# Patient Record
Sex: Female | Born: 1980 | Race: Black or African American | Hispanic: No | Marital: Single | Smoking: Never smoker
Health system: Southern US, Community
[De-identification: ages and names within clinical notes are randomized; demographics above are authoritative.]

## PROBLEM LIST (undated history)

## (undated) DIAGNOSIS — I10 Essential (primary) hypertension: Secondary | ICD-10-CM

## (undated) DIAGNOSIS — J45909 Unspecified asthma, uncomplicated: Secondary | ICD-10-CM

## (undated) HISTORY — PX: ABDOMINAL HYSTERECTOMY: SHX81

---

## 2013-09-06 ENCOUNTER — Ambulatory Visit (HOSPITAL_COMMUNITY)
Admission: RE | Admit: 2013-09-06 | Discharge: 2013-09-06 | Disposition: A | Discharge status: Home / Self Care | Payer: BC Managed Care – PPO | Source: Ambulatory Visit | Attending: Allergy and Immunology | Admitting: Allergy and Immunology

## 2013-09-06 ENCOUNTER — Other Ambulatory Visit: Payer: Self-pay | Admitting: Allergy and Immunology

## 2013-09-06 DIAGNOSIS — R05 Cough: Secondary | ICD-10-CM

## 2013-09-06 DIAGNOSIS — R059 Cough, unspecified: Secondary | ICD-10-CM

## 2014-01-14 ENCOUNTER — Emergency Department (HOSPITAL_COMMUNITY)
Admission: EM | Admit: 2014-01-14 | Discharge: 2014-01-14 | Disposition: A | Discharge status: Home / Self Care | Payer: BC Managed Care – PPO | Attending: Emergency Medicine | Admitting: Emergency Medicine

## 2014-01-14 ENCOUNTER — Encounter (HOSPITAL_COMMUNITY): Payer: Self-pay | Admitting: Emergency Medicine

## 2014-01-14 DIAGNOSIS — S161XXA Strain of muscle, fascia and tendon at neck level, initial encounter: Secondary | ICD-10-CM | POA: Diagnosis not present

## 2014-01-14 DIAGNOSIS — S3992XA Unspecified injury of lower back, initial encounter: Secondary | ICD-10-CM | POA: Diagnosis not present

## 2014-01-14 DIAGNOSIS — Y9389 Activity, other specified: Secondary | ICD-10-CM | POA: Insufficient documentation

## 2014-01-14 DIAGNOSIS — Y92410 Unspecified street and highway as the place of occurrence of the external cause: Secondary | ICD-10-CM | POA: Insufficient documentation

## 2014-01-14 DIAGNOSIS — J45909 Unspecified asthma, uncomplicated: Secondary | ICD-10-CM | POA: Diagnosis not present

## 2014-01-14 DIAGNOSIS — S199XXA Unspecified injury of neck, initial encounter: Secondary | ICD-10-CM | POA: Diagnosis present

## 2014-01-14 DIAGNOSIS — Y998 Other external cause status: Secondary | ICD-10-CM | POA: Diagnosis not present

## 2014-01-14 HISTORY — DX: Unspecified asthma, uncomplicated: J45.909

## 2014-01-14 MED ORDER — NAPROXEN 500 MG PO TABS: 500.0000 mg | ORAL_TABLET | Freq: Two times a day (BID) | ORAL | Status: DC

## 2014-01-14 NOTE — Discharge Instructions (Signed)

## 2014-01-14 NOTE — ED Notes (Signed)
Pt alert & oriented x4, stable gait. Patient given discharge instructions, paperwork & prescription(s). Patient  instructed to stop at the registration desk to finish any additional paperwork. Patient verbalized understanding. Pt left department w/ no further questions. 
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
## 2014-09-01 ENCOUNTER — Encounter (HOSPITAL_COMMUNITY): Payer: Self-pay | Admitting: Emergency Medicine

## 2014-09-01 ENCOUNTER — Emergency Department (HOSPITAL_COMMUNITY)
Admission: EM | Admit: 2014-09-01 | Discharge: 2014-09-01 | Disposition: A | Discharge status: Home / Self Care | Payer: BLUE CROSS/BLUE SHIELD | Attending: Emergency Medicine | Admitting: Emergency Medicine

## 2014-09-01 DIAGNOSIS — Z7951 Long term (current) use of inhaled steroids: Secondary | ICD-10-CM | POA: Insufficient documentation

## 2014-09-01 DIAGNOSIS — Z7901 Long term (current) use of anticoagulants: Secondary | ICD-10-CM | POA: Insufficient documentation

## 2014-09-01 DIAGNOSIS — M791 Myalgia, unspecified site: Secondary | ICD-10-CM

## 2014-09-01 DIAGNOSIS — J45909 Unspecified asthma, uncomplicated: Secondary | ICD-10-CM | POA: Insufficient documentation

## 2014-09-01 DIAGNOSIS — G8918 Other acute postprocedural pain: Secondary | ICD-10-CM | POA: Diagnosis not present

## 2014-09-01 DIAGNOSIS — M7981 Nontraumatic hematoma of soft tissue: Secondary | ICD-10-CM | POA: Diagnosis not present

## 2014-09-01 DIAGNOSIS — M79602 Pain in left arm: Secondary | ICD-10-CM | POA: Diagnosis present

## 2014-09-01 MED ORDER — IBUPROFEN 800 MG PO TABS: 800.0000 mg | ORAL_TABLET | Freq: Three times a day (TID) | ORAL | Status: DC | PRN

## 2014-09-01 MED ORDER — IBUPROFEN 800 MG PO TABS
800.0000 mg | ORAL_TABLET | Freq: Once | ORAL | Status: AC
  Administered 2014-09-01: 800 mg via ORAL
  Filled 2014-09-01: qty 1

## 2014-09-01 NOTE — ED Notes (Signed)
   09/01/14 [id number]  Musculoskeletal  Musculoskeletal (WDL) X  LUE Limited movement;Other (Comment)  Pt c/o left upper arm pain since getting vaccine last Tuesday. There is a small bruise the the left upper arm.

## 2014-09-01 NOTE — Discharge Instructions (Signed)
You were seen today for arm pain. It is at the site of your tetanus shot injection. It is not uncommon to have soreness at the site of injection especially after a tetanus vaccine. Below his general information regarding tetanus vaccines. You should use anti-inflammatory medication like ibuprofen for pain management. Use ice to the site. If you develops redness, increasing pain, or tingling or numbness of the arm you should be reevaluated.  Diphtheria/Tetanus Toxoids; Pertussis Vaccine, DTP injection What is this medicine? DIPHTHERIA and TETANUS TOXOIDS; PERTUSSIS VACCINE (dif THEER ee uh and TET n [place] TOK soids; per TUS iss VAK seen) is used to prevent diphtheria, tetanus, and pertussis infections. This medicine may be used for other purposes; ask your health care provider or pharmacist if you have questions. COMMON BRAND NAME(S): Adacel, Boostrix, Certiva, Daptacel, Infanrix, Tripedia What should I tell my health care provider before I take this medicine? They need to know if you have any of these conditions: -blood disorders like hemophilia -fever or infection -immune system problems -neurologic disease -seizures -an unusual or allergic reaction to vaccines, thimerosal, latex, other medicines, foods, dyes, or preservatives -pregnant or trying to get pregnant -breast-feeding How should I use this medicine? This vaccine is for injection into a muscle. It is given by a health care professional. A copy of Vaccine Information Statements will be given before each vaccination. Read this sheet carefully each time. The sheet may change frequently. Talk to your pediatrician regarding the use of this vaccine in children. While the DTP vaccine may be given to children ages 6 weeks to 7 years and the Tdap vaccine may be given to children at least 35 years old, precautions do apply. Overdosage: If you think you have taken too much of this medicine contact a poison control center or emergency room at  once. NOTE: This medicine is only for you. Do not share this medicine with others. What if I miss a dose? It is important not to miss your dose. Call your doctor or health care professional if you are unable to keep an appointment. What may interact with this medicine? -immune globulin -medicines that suppress your immune function like adalimumab, anakinra, infliximab -medicines to treat cancer -medicines that treat or prevent blood clots like warfarin, enoxaparin, and dalteparin -steroid medicines like prednisone or cortisone This list may not describe all possible interactions. Give your health care provider a list of all the medicines, herbs, non-prescription drugs, or dietary supplements you use. Also tell them if you smoke, drink alcohol, or use illegal drugs. Some items may interact with your medicine. What should I watch for while using this medicine? See your health care provider for all shots of this vaccine as directed. To have protection from infection, you must have 3 shots of this vaccine plus boosters as needed. Tell your doctor right away if you have any serious or unusual side effects after getting this vaccine. What side effects may I notice from receiving this medicine? Side effects that you should report to your doctor or health care professional as soon as possible: -allergic reactions like skin rash, itching or hives, swelling of the face, lips, or tongue -breathing problems -fever of 103 degrees F or more -flu-like symptoms -inconsolable crying -infection -pain, tingling, numbness in the hands or feet -seizures -swelling of arm or leg that was injected -unusually weak or tired Side effects that usually do not require immediate medical attention (report these side effects to your doctor or health care professional if they continue  or are bothersome): -fussy, irritable -loss of appetite -fever of 102 degrees F or less -pain, tenderness, redness, swelling, or a 'knot'  at site where injected -vomiting This list may not describe all possible side effects. Call your doctor for medical advice about side effects. You may report side effects to FDA at 1-800-FDA-1088. Where should I keep my medicine? This drug is given in a hospital or clinic and will not be stored at home. NOTE: This sheet is a summary. It may not cover all possible information. If you have questions about this medicine, talk to your doctor, pharmacist, or health care provider.  2015, Elsevier/Gold Standard. (2013-06-21 21:24:02)

## 2014-09-01 NOTE — ED Provider Notes (Signed)
CSN: [id number]     Arrival date & time 09/01/14  0620 History   First MD Initiated Contact with Patient 09/01/14 0631     Chief Complaint  Patient presents with  . Arm Pain     (Consider location/radiation/quality/duration/timing/severity/associated sxs/prior Treatment) HPI  This is a 34 year old female who presents with pain in the left arm. Patient reports that she has had increasing pain in the left upper extremity following a tetanus injection last Tuesday. She states that she was initially sore but had improved but developed repeat pain this week.  She used a hot pack last night which did not help.  Currently rates her pain a 9 out of 10. She had some Norco at home for dental procedure and states that that did not help. She has noted some bruising right at the injection site.  Past Medical History  Diagnosis Date  . Asthma    Past Surgical History  Procedure Laterality Date  . Abdominal hysterectomy      partial 2014   History reviewed. No pertinent family history. History  Substance Use Topics  . Smoking status: Never Smoker   . Smokeless tobacco: Not on file  . Alcohol Use: No   OB History    No data available     Review of Systems  Musculoskeletal:       Arm pain  Skin:       bruising      Allergies  Review of patient's allergies indicates no known allergies.  Home Medications   Prior to Admission medications   Medication Sig Start Date End Date Taking? Authorizing Provider  ibuprofen (ADVIL,MOTRIN) 800 MG tablet Take 1 tablet (800 mg total) by mouth every 8 (eight) hours as needed. 09/01/14   Shon Batonourtney F Horton, MD  mometasone (ASMANEX) 220 MCG/INH inhaler Inhale 1-2 puffs into the lungs daily.    Historical Provider, MD  naproxen (NAPROSYN) 500 MG tablet Take 1 tablet (500 mg total) by mouth 2 (two) times daily. 01/14/14   Azalia BilisKevin Campos, MD   BP 144/87 mmHg  Pulse 79  Temp(Src) 98.4 F (36.9 C)  Resp 18  Ht 5\' 6"  (1.676 m)  Wt 177 lb (80.287 kg)   BMI 28.58 kg/m2  SpO2 99%  LMP 09/01/2012 Physical Exam  Constitutional: She is oriented to person, place, and time. She appears well-developed and well-nourished.  HENT:  Head: Normocephalic and atraumatic.  Cardiovascular: Normal rate and regular rhythm.   Pulmonary/Chest: Effort normal. No respiratory distress.  Musculoskeletal:  Tenderness palpation over the left deltoid, mild bruising noted, no significant swelling, neurovascularly intact distally  Neurological: She is alert and oriented to person, place, and time.  Skin: Skin is warm and dry.  Psychiatric: She has a normal mood and affect.  Nursing note and vitals reviewed.   ED Course  Procedures (including critical care time) Labs Review Labs Reviewed - No data to display  Imaging Review No results found.   EKG Interpretation None      MDM   Final diagnoses:  Muscle pain    Patient presents with musculoskeletal pain at tdap Injection site. Mild bruising noted at the site.  Patient has recently had worsening of pain. She has not been taking anti-inflammatory. She's been applying heat. This is likely worsening the pain. Pain is right over the injection site. No obvious injuries, inflammation. Discussed with patient high dose anti-inflammatory and ice to the site. Avoid heavy lifting with the left arm until pain has improved.  After history,  exam, and medical workup I feel the patient has been appropriately medically screened and is safe for discharge home. Pertinent diagnoses were discussed with the patient. Patient was given return precautions.     Shon Baton, MD 09/04/14 [phone]

## 2014-09-01 NOTE — ED Notes (Signed)
Pt c/o left arm pain since getting hepatitis vaccine over a week ago.

## 2017-05-02 DIAGNOSIS — R109 Unspecified abdominal pain: Secondary | ICD-10-CM | POA: Insufficient documentation

## 2017-05-02 DIAGNOSIS — Z8742 Personal history of other diseases of the female genital tract: Secondary | ICD-10-CM | POA: Insufficient documentation

## 2017-05-02 DIAGNOSIS — R300 Dysuria: Secondary | ICD-10-CM | POA: Insufficient documentation

## 2017-05-02 DIAGNOSIS — A499 Bacterial infection, unspecified: Secondary | ICD-10-CM | POA: Insufficient documentation

## 2017-05-02 DIAGNOSIS — E669 Obesity, unspecified: Secondary | ICD-10-CM | POA: Insufficient documentation

## 2017-05-14 DIAGNOSIS — I10 Essential (primary) hypertension: Secondary | ICD-10-CM | POA: Insufficient documentation

## 2017-06-02 ENCOUNTER — Other Ambulatory Visit (HOSPITAL_COMMUNITY): Payer: Self-pay | Admitting: Neurology

## 2017-06-02 DIAGNOSIS — R569 Unspecified convulsions: Secondary | ICD-10-CM

## 2017-06-05 ENCOUNTER — Encounter (HOSPITAL_COMMUNITY): Payer: Self-pay

## 2017-06-05 ENCOUNTER — Ambulatory Visit (HOSPITAL_COMMUNITY)
Admission: RE | Admit: 2017-06-05 | Discharge: 2017-06-05 | Disposition: A | Discharge status: Home / Self Care | Payer: BLUE CROSS/BLUE SHIELD | Source: Ambulatory Visit | Attending: Neurology | Admitting: Neurology

## 2017-06-10 ENCOUNTER — Ambulatory Visit (HOSPITAL_COMMUNITY): Payer: BLUE CROSS/BLUE SHIELD

## 2017-06-10 ENCOUNTER — Ambulatory Visit (HOSPITAL_COMMUNITY)
Admission: RE | Admit: 2017-06-10 | Discharge: 2017-06-10 | Disposition: A | Discharge status: Home / Self Care | Payer: BLUE CROSS/BLUE SHIELD | Source: Ambulatory Visit | Attending: Neurology | Admitting: Neurology

## 2017-06-10 DIAGNOSIS — J349 Unspecified disorder of nose and nasal sinuses: Secondary | ICD-10-CM | POA: Insufficient documentation

## 2017-06-10 DIAGNOSIS — R569 Unspecified convulsions: Secondary | ICD-10-CM | POA: Diagnosis present

## 2017-06-10 LAB — POCT I-STAT CREATININE: CREATININE: 1.1 mg/dL — AB (ref 0.44–1.00)

## 2017-06-10 MED ORDER — GADOBENATE DIMEGLUMINE 529 MG/ML IV SOLN
15.0000 mL | Freq: Once | INTRAVENOUS | Status: AC | PRN
  Administered 2017-06-10: 15 mL via INTRAVENOUS

## 2018-05-08 ENCOUNTER — Other Ambulatory Visit (HOSPITAL_COMMUNITY)
Admission: RE | Admit: 2018-05-08 | Discharge: 2018-05-08 | Disposition: A | Discharge status: Home / Self Care | Payer: BLUE CROSS/BLUE SHIELD | Source: Ambulatory Visit | Attending: Internal Medicine | Admitting: Internal Medicine

## 2018-05-08 DIAGNOSIS — R319 Hematuria, unspecified: Secondary | ICD-10-CM | POA: Diagnosis not present

## 2018-05-08 LAB — URINALYSIS, ROUTINE W REFLEX MICROSCOPIC
BILIRUBIN URINE: NEGATIVE
GLUCOSE, UA: NEGATIVE mg/dL
Hgb urine dipstick: NEGATIVE
Ketones, ur: NEGATIVE mg/dL
Leukocytes,Ua: NEGATIVE
Nitrite: NEGATIVE
PH: 6 (ref 5.0–8.0)
PROTEIN: NEGATIVE mg/dL
Specific Gravity, Urine: 1.025 (ref 1.005–1.030)

## 2018-06-03 ENCOUNTER — Emergency Department (HOSPITAL_COMMUNITY)
Admission: EM | Admit: 2018-06-03 | Discharge: 2018-06-03 | Disposition: A | Discharge status: Home / Self Care | Payer: BLUE CROSS/BLUE SHIELD | Attending: Emergency Medicine | Admitting: Emergency Medicine

## 2018-06-03 ENCOUNTER — Encounter (HOSPITAL_COMMUNITY): Payer: Self-pay | Admitting: Emergency Medicine

## 2018-06-03 ENCOUNTER — Emergency Department (HOSPITAL_COMMUNITY): Payer: BLUE CROSS/BLUE SHIELD

## 2018-06-03 ENCOUNTER — Other Ambulatory Visit: Payer: Self-pay

## 2018-06-03 DIAGNOSIS — R0602 Shortness of breath: Secondary | ICD-10-CM | POA: Diagnosis not present

## 2018-06-03 DIAGNOSIS — I10 Essential (primary) hypertension: Secondary | ICD-10-CM | POA: Insufficient documentation

## 2018-06-03 DIAGNOSIS — R0789 Other chest pain: Secondary | ICD-10-CM | POA: Insufficient documentation

## 2018-06-03 DIAGNOSIS — R1013 Epigastric pain: Secondary | ICD-10-CM | POA: Diagnosis present

## 2018-06-03 DIAGNOSIS — J45909 Unspecified asthma, uncomplicated: Secondary | ICD-10-CM | POA: Diagnosis not present

## 2018-06-03 DIAGNOSIS — R52 Pain, unspecified: Secondary | ICD-10-CM

## 2018-06-03 DIAGNOSIS — R079 Chest pain, unspecified: Secondary | ICD-10-CM | POA: Insufficient documentation

## 2018-06-03 DIAGNOSIS — Z79899 Other long term (current) drug therapy: Secondary | ICD-10-CM | POA: Diagnosis not present

## 2018-06-03 HISTORY — DX: Essential (primary) hypertension: I10

## 2018-06-03 LAB — BASIC METABOLIC PANEL
Anion gap: 9 (ref 5–15)
BUN: 12 mg/dL (ref 6–20)
CO2: 25 mmol/L (ref 22–32)
Calcium: 9.4 mg/dL (ref 8.9–10.3)
Chloride: 104 mmol/L (ref 98–111)
Creatinine, Ser: 1.04 mg/dL — ABNORMAL HIGH (ref 0.44–1.00)
GFR calc Af Amer: >60 mL/min (ref >60)
GFR calc non Af Amer: >60 mL/min (ref >60)
Glucose, Bld: 113 mg/dL — ABNORMAL HIGH (ref 70–99)
Potassium: 4.2 mmol/L (ref 3.5–5.1)
Sodium: 138 mmol/L (ref 135–145)

## 2018-06-03 LAB — CBC
HCT: 40.7 % (ref 36.0–46.0)
Hemoglobin: 13.1 g/dL (ref 12.0–15.0)
MCH: 28.5 pg (ref 26.0–34.0)
MCHC: 32.2 g/dL (ref 30.0–36.0)
MCV: 88.5 fL (ref 80.0–100.0)
Platelets: 271 10*3/uL (ref 150–400)
RBC: 4.6 MIL/uL (ref 3.87–5.11)
RDW: 13.1 % (ref 11.5–15.5)
WBC: 3 10*3/uL — ABNORMAL LOW (ref 4.0–10.5)
nRBC: 0 % (ref 0.0–0.2)

## 2018-06-03 LAB — TROPONIN I: Troponin I: <0.03 ng/mL (ref <0.03)

## 2018-06-03 MED ORDER — SODIUM CHLORIDE 0.9% FLUSH: 3.0000 mL | Freq: Once | INTRAVENOUS | Status: DC

## 2018-06-03 MED ORDER — PANTOPRAZOLE SODIUM 20 MG PO TBEC: 20.0000 mg | DELAYED_RELEASE_TABLET | Freq: Every day | ORAL | 0 refills | Status: DC

## 2018-06-03 MED ORDER — ALUM & MAG HYDROXIDE-SIMETH 200-200-20 MG/5ML PO SUSP
30.0000 mL | Freq: Once | ORAL | Status: AC
  Administered 2018-06-03: 30 mL via ORAL
  Filled 2018-06-03: qty 30

## 2018-06-03 MED ORDER — ALBUTEROL SULFATE HFA 108 (90 BASE) MCG/ACT IN AERS
2.0000 | INHALATION_SPRAY | RESPIRATORY_TRACT | Status: DC | PRN
  Administered 2018-06-03: 14:00:00 2 via RESPIRATORY_TRACT
  Filled 2018-06-03: qty 6.7

## 2018-06-03 MED ORDER — LIDOCAINE VISCOUS HCL 2 % MT SOLN
15.0000 mL | Freq: Once | OROMUCOSAL | Status: AC
  Administered 2018-06-03: 12:00:00 15 mL via ORAL
  Filled 2018-06-03: qty 15

## 2018-06-03 NOTE — ED Provider Notes (Signed)
[facility] EMERGENCY DEPARTMENT Provider Note   CSN: [id number] Arrival date & time: 06/03/18  1039    History   Chief Complaint Chief Complaint  Patient presents with  . Chest Pain    HPI Nancy Powell is a 38 y.o. female.     Patient complains of epigastric and substernal pain.  With mild shortness of breath.  Patient states that she did work with someone who with covid-19 positive.  Patient has no fever  The history is provided by the patient. No language interpreter was used.  Chest Pain  Pain location:  Substernal area Pain quality: aching   Pain radiates to:  Does not radiate Pain severity:  Mild Onset quality:  Sudden Timing:  Intermittent Progression:  Waxing and waning Chronicity:  New Context: not breathing   Associated symptoms: abdominal pain   Associated symptoms: no back pain, no cough, no fatigue and no headache     Past Medical History:  Diagnosis Date  . Asthma   . Hypertension     There are no active problems to display for this patient.   Past Surgical History:  Procedure Laterality Date  . ABDOMINAL HYSTERECTOMY     partial 2014     OB History   No obstetric history on file.      Home Medications    Prior to Admission medications   Medication Sig Start Date End Date Taking? Authorizing Provider  hydrochlorothiazide (HYDRODIURIL) 12.5 MG tablet Take 1 tablet by mouth daily. 02/17/18  Yes [provider]  losartan (COZAAR) 50 MG tablet Take 1 tablet by mouth daily. 05/20/18  Yes [provider]  mometasone (ASMANEX) 220 MCG/INH inhaler Inhale 1-2 puffs into the lungs daily.   Yes [provider]  rizatriptan (MAXALT) 10 MG tablet Take 10 mg by mouth as needed for migraine. May repeat in 2 hours if needed   Yes [provider]  topiramate (TOPAMAX) 25 MG tablet Take 1 tablet by mouth 2 (two) times daily. 02/23/18  Yes [provider]  ibuprofen (ADVIL,MOTRIN) 800 MG tablet Take 1 tablet  (800 mg total) by mouth every 8 (eight) hours as needed. Patient not taking: Reported on 06/03/2018 09/01/14   Horton, Mayer Masker, MD  naproxen (NAPROSYN) 500 MG tablet Take 1 tablet (500 mg total) by mouth 2 (two) times daily. Patient not taking: Reported on 06/03/2018 01/14/14   Azalia Bilis, MD  pantoprazole (PROTONIX) 20 MG tablet Take 1 tablet (20 mg total) by mouth daily. 06/03/18   Bethann Berkshire, MD    Family History No family history on file.  Social History Social History   Tobacco Use  . Smoking status: Never Smoker  . Smokeless tobacco: Never Used  Substance Use Topics  . Alcohol use: No  . Drug use: No     Allergies   Patient has no known allergies.   Review of Systems Review of Systems  Constitutional: Negative for appetite change and fatigue.  HENT: Negative for congestion, ear discharge and sinus pressure.   Eyes: Negative for discharge.  Respiratory: Negative for cough.   Cardiovascular: Positive for chest pain.  Gastrointestinal: Positive for abdominal pain. Negative for diarrhea.  Genitourinary: Negative for frequency and hematuria.  Musculoskeletal: Negative for back pain.  Skin: Negative for rash.  Neurological: Negative for seizures and headaches.  Psychiatric/Behavioral: Negative for hallucinations.     Physical Exam Updated Vital Signs BP 113/84   Pulse 60   Temp 98.3 F (36.8 C)   Resp 13  Ht  (1.651 m)   Wt 82.1 kg   LMP 09/01/2012 Comment: Partial Hysterectomy  SpO2 100%   BMI 30.12 kg/m   Physical Exam Constitutional:      Appearance: She is well-developed.  HENT:     Head: Normocephalic.  Eyes:     General: No scleral icterus.    Conjunctiva/sclera: Conjunctivae normal.  Neck:     Musculoskeletal: Neck supple.     Thyroid: No thyromegaly.  Cardiovascular:     Rate and Rhythm: Normal rate and regular rhythm.     Heart sounds: No murmur. No friction rub. No gallop.   Pulmonary:     Breath sounds: No stridor. Wheezing  present. No rales.  Chest:     Chest wall: No tenderness.  Abdominal:     General: There is no distension.     Tenderness: There is no abdominal tenderness. There is no rebound.  Musculoskeletal: Normal range of motion.  Lymphadenopathy:     Cervical: No cervical adenopathy.  Skin:    Findings: No erythema or rash.  Neurological:     Mental Status: She is oriented to person, place, and time.     Motor: No abnormal muscle tone.     Coordination: Coordination normal.  Psychiatric:        Behavior: Behavior normal.      ED Treatments / Results  Labs (all labs ordered are listed, but only abnormal results are displayed) Labs Reviewed  BASIC METABOLIC PANEL - Abnormal; Notable for the following components:      Result Value   Glucose, Bld 113 (*)    Creatinine, Ser 1.04 (*)    All other components within normal limits  CBC - Abnormal; Notable for the following components:   WBC 3.0 (*)    All other components within normal limits  TROPONIN I    EKG EKG Interpretation  Date/Time:  Wednesday June 03 2018 11:04:42 EDT Ventricular Rate:  71 PR Interval:    QRS Duration: 90 QT Interval:  377 QTC Calculation: 410 R Axis:   82 Text Interpretation:  Sinus rhythm Confirmed by Bethann Berkshire [phone]) on 06/03/2018 1:18:17 PM   Radiology Dg Chest Port 1 View  Result Date: 06/03/2018 CLINICAL DATA:  38 year old female with a history of chest pain EXAM: PORTABLE CHEST 1 VIEW COMPARISON:  09/06/2013 FINDINGS: The heart size and mediastinal contours are within normal limits. Both lungs are clear. The visualized skeletal structures are unremarkable. IMPRESSION: Negative for acute cardiopulmonary disease Electronically Signed   By: Gilmer Mor D.O.   On: 06/03/2018 12:09    Procedures Procedures (including critical care time)  Medications Ordered in ED Medications  sodium chloride flush (NS) 0.9 % injection 3 mL (has no administration in time range)  albuterol (PROVENTIL  HFA;VENTOLIN HFA) 108 (90 Base) MCG/ACT inhaler 2 puff (has no administration in time range)  alum & mag hydroxide-simeth (MAALOX/MYLANTA) 200-200-20 MG/5ML suspension 30 mL (30 mLs Oral Given 06/03/18 1206)    And  lidocaine (XYLOCAINE) 2 % viscous mouth solution 15 mL (15 mLs Oral Given 06/03/18 1206)     Initial Impression / Assessment and Plan / ED Course  I have reviewed the triage vital signs and the nursing notes.  Pertinent labs & imaging results that were available during my care of the patient were reviewed by me and considered in my medical decision making (see chart for details).        Patient with chest discomfort.  Suspect it is related  to GERD she will be placed on Protonix.  Also she is given albuterol inhaler for her asthma and will follow-up with her PCP  Final Clinical Impressions(s) / ED Diagnoses   Final diagnoses:  Atypical chest pain    ED Discharge Orders         Ordered    pantoprazole (PROTONIX) 20 MG tablet  Daily     06/03/18 1334           Bethann Berkshire, MD 06/03/18 1339

## 2018-06-03 NOTE — Discharge Instructions (Addendum)
Use the albuterol inhaler every 6 hours if needed for some shortness of breath.  Follow-up with your family doctor if any problems.  Self quarantine if you develop any fever.      Person Under Monitoring Name: Nancy Powell  Location: [address] [place] [address]   Infection Prevention Recommendations for Individuals Confirmed to have, or Being Evaluated for, 2019 Novel Coronavirus (COVID-19) Infection Who Receive Care at Home  Individuals who are confirmed to have, or are being evaluated for, COVID-19 should follow the prevention steps below until a healthcare provider or local or state health department says they can return to normal activities.  Stay home except to get medical care You should restrict activities outside your home, except for getting medical care. Do not go to work, school, or public areas, and do not use public transportation or taxis.  Call ahead before visiting your doctor Before your medical appointment, call the healthcare provider and tell them that you have, or are being evaluated for, COVID-19 infection. This will help the healthcare providers office take steps to keep other people from getting infected. Ask your healthcare provider to call the local or state health department.  Monitor your symptoms Seek prompt medical attention if your illness is worsening (e.g., difficulty breathing). Before going to your medical appointment, call the healthcare provider and tell them that you have, or are being evaluated for, COVID-19 infection. Ask your healthcare provider to call the local or state health department.  Wear a facemask You should wear a facemask that covers your nose and mouth when you are in the same room with other people and when you visit a healthcare provider. People who live with or visit you should also wear a facemask while they are in the same room with you.  Separate yourself from other people in your home As much as possible, you  should stay in a different room from other people in your home. Also, you should use a separate bathroom, if available.  Avoid sharing household items You should not share dishes, drinking glasses, cups, eating utensils, towels, bedding, or other items with other people in your home. After using these items, you should wash them thoroughly with soap and water.  Cover your coughs and sneezes Cover your mouth and nose with a tissue when you cough or sneeze, or you can cough or sneeze into your sleeve. Throw used tissues in a lined trash can, and immediately wash your hands with soap and water for at least 20 seconds or use an alcohol-based hand rub.  Wash your [employer] your hands often and thoroughly with soap and water for at least 20 seconds. You can use an alcohol-based hand sanitizer if soap and water are not available and if your hands are not visibly dirty. Avoid touching your eyes, nose, and mouth with unwashed hands.   Prevention Steps for Caregivers and Household Members of Individuals Confirmed to have, or Being Evaluated for, COVID-19 Infection Being Cared for in the Home  If you live with, or provide care at home for, a person confirmed to have, or being evaluated for, COVID-19 infection please follow these guidelines to prevent infection:  Follow healthcare providers instructions Make sure that you understand and can help the patient follow any healthcare provider instructions for all care.  Provide for the patients basic needs You should help the patient with basic needs in the home and provide support for getting groceries, prescriptions, and other personal needs.  Monitor the patients  symptoms If they are getting sicker, call his or her medical provider and tell them that the patient has, or is being evaluated for, COVID-19 infection. This will help the healthcare providers office take steps to keep other people from getting infected. Ask the healthcare provider  to call the local or state health department.  Limit the number of people who have contact with the patient If possible, have only one caregiver for the patient. Other household members should stay in another home or place of residence. If this is not possible, they should stay in another room, or be separated from the patient as much as possible. Use a separate bathroom, if available. Restrict visitors who do not have an essential need to be in the home.  Keep older adults, very young children, and other sick people away from the patient Keep older adults, very young children, and those who have compromised immune systems or chronic health conditions away from the patient. This includes people with chronic heart, lung, or kidney conditions, diabetes, and cancer.  Ensure good ventilation Make sure that shared spaces in the home have good air flow, such as from an air conditioner or an opened window, weather permitting.  Wash your hands often Wash your hands often and thoroughly with soap and water for at least 20 seconds. You can use an alcohol based hand sanitizer if soap and water are not available and if your hands are not visibly dirty. Avoid touching your eyes, nose, and mouth with unwashed hands. Use disposable paper towels to dry your hands. If not available, use dedicated cloth towels and replace them when they become wet.  Wear a facemask and gloves Wear a disposable facemask at all times in the room and gloves when you touch or have contact with the patients blood, body fluids, and/or secretions or excretions, such as sweat, saliva, sputum, nasal mucus, vomit, urine, or feces.  Ensure the mask fits over your nose and mouth tightly, and do not touch it during use. Throw out disposable facemasks and gloves after using them. Do not reuse. Wash your hands immediately after removing your facemask and gloves. If your personal clothing becomes contaminated, carefully remove clothing and  launder. Wash your hands after handling contaminated clothing. Place all used disposable facemasks, gloves, and other waste in a lined container before disposing them with other household waste. Remove gloves and wash your hands immediately after handling these items.  Do not share dishes, glasses, or other household items with the patient Avoid sharing household items. You should not share dishes, drinking glasses, cups, eating utensils, towels, bedding, or other items with a patient who is confirmed to have, or being evaluated for, COVID-19 infection. After the person uses these items, you should wash them thoroughly with soap and water.  Wash laundry thoroughly Immediately remove and wash clothes or bedding that have blood, body fluids, and/or secretions or excretions, such as sweat, saliva, sputum, nasal mucus, vomit, urine, or feces, on them. Wear gloves when handling laundry from the patient. Read and follow directions on labels of laundry or clothing items and detergent. In general, wash and dry with the warmest temperatures recommended on the label.  Clean all areas the individual has used often Clean all touchable surfaces, such as counters, tabletops, doorknobs, bathroom fixtures, toilets, phones, keyboards, tablets, and bedside tables, every day. Also, clean any surfaces that may have blood, body fluids, and/or secretions or excretions on them. Wear gloves when cleaning surfaces the patient has come in contact  with. Use a diluted bleach solution (e.g., dilute bleach with 1 part bleach and 10 parts water) or a household disinfectant with a label that says EPA-registered for coronaviruses. To make a bleach solution at home, add 1 tablespoon of bleach to 1 quart (4 cups) of water. For a larger supply, add  cup of bleach to 1 gallon (16 cups) of water. Read labels of cleaning products and follow recommendations provided on product labels. Labels contain instructions for safe and effective  use of the cleaning product including precautions you should take when applying the product, such as wearing gloves or eye protection and making sure you have good ventilation during use of the product. Remove gloves and wash hands immediately after cleaning.  Monitor yourself for signs and symptoms of illness Caregivers and household members are considered close contacts, should monitor their health, and will be asked to limit movement outside of the home to the extent possible. Follow the monitoring steps for close contacts listed on the symptom monitoring form.   ? If you have additional questions, contact your local health department or call the epidemiologist on call at [phone] (available 24/7). ? This guidance is subject to change. For the most up-to-date guidance from [facility], please refer to their website: [email or web address]

## 2018-06-03 NOTE — ED Triage Notes (Signed)
Pt states she has central chest pain since last week.    Coworker tested positive for COVID.  Was told about the coworker on Monday

## 2019-05-10 IMAGING — MR MR HEAD WO/W CM
8 of 13 series · 27 of 48 positions shown · IV contrast (multihance)
Comparison: None.

CLINICAL DATA: Headache over the last 2 months. History seizures 2
years ago.

EXAM:
MRI HEAD WITHOUT AND WITH CONTRAST
TECHNIQUE: Multiplanar, multiecho pulse sequences of the brain and surrounding
structures were obtained without and with intravenous contrast.
CONTRAST:  15mL MULTIHANCE GADOBENATE DIMEGLUMINE 529 MG/ML IV SOLN

[Series 4: DWI · axial · 3.0mm · 0.65mm/px · z∈[-30,+129]mm · 5 of 54 slices shown (1 of 4)]
[im 1/54]
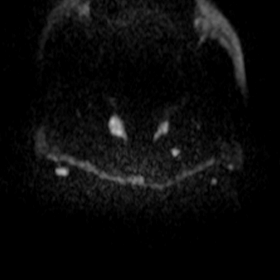
[im 14/54]
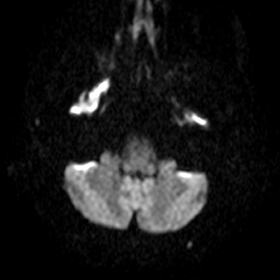
[im 27/54]
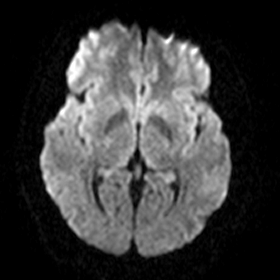
[im 40/54]
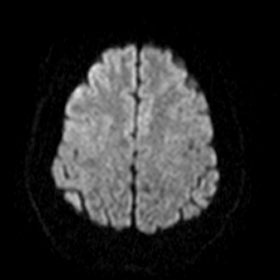
[im 54/54]
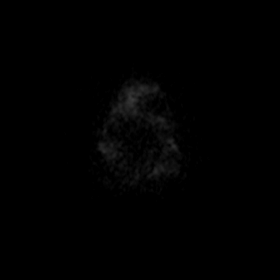

[Series 5: DWI · axial · 3.0mm · 0.71mm/px · z∈[-33,+129]mm · 5 of 55 slices shown (2 of 4)]
[im 1/55]
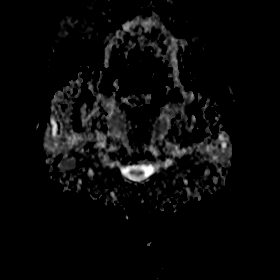
[im 14/55]
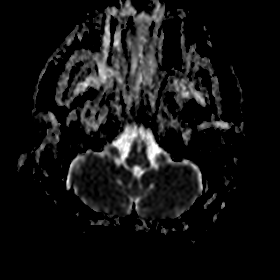
[im 28/55]
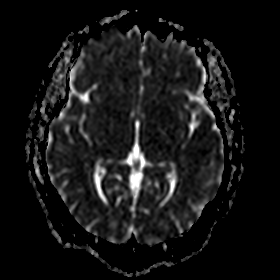
[im 41/55]
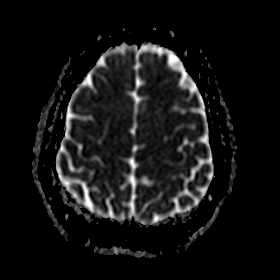
[im 55/55]
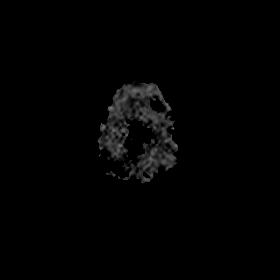

[Series 6: DWI · coronal · 5.0mm · 0.47mm/px · 3 of 34 slices shown (3 of 4)]
[im 1/34]
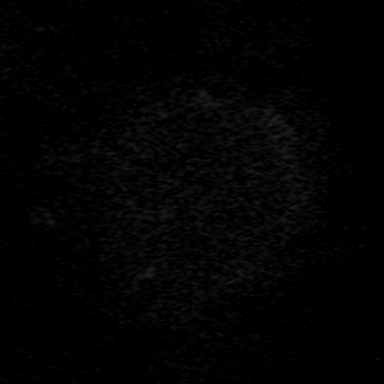
[im 17/34]
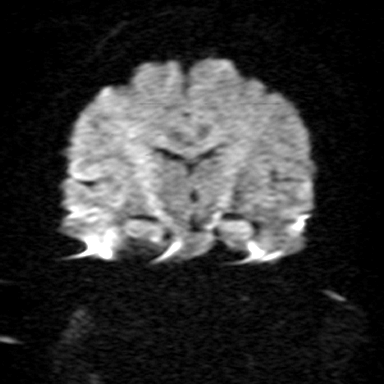
[im 34/34]
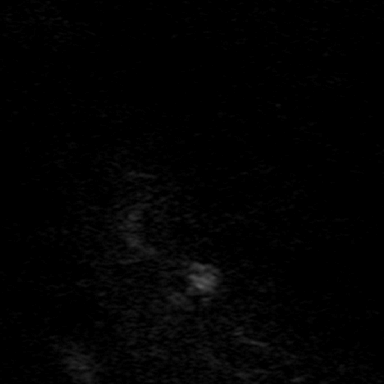

[Series 7: DWI · coronal · 5.0mm · 0.52mm/px · 3 of 34 slices shown (4 of 4)]
[im 1/34]
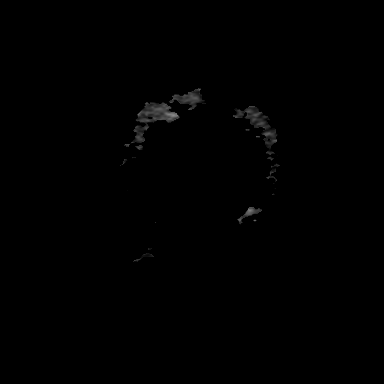
[im 17/34]
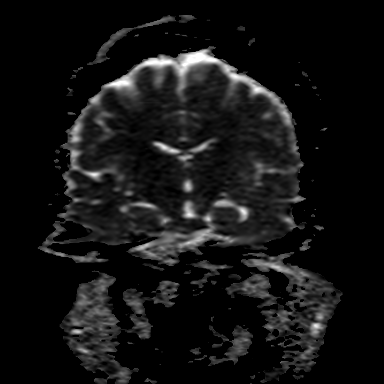
[im 34/34]
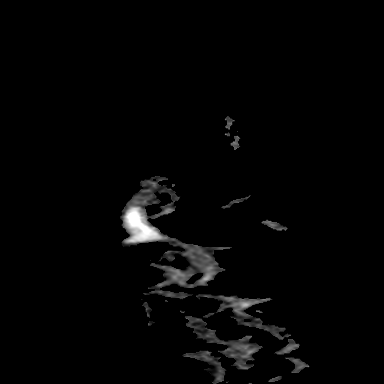

[Series 8: T2 · axial · 5.0mm · 0.65mm/px · z∈[-14,+129]mm · 2 of 23 slices shown (1 of 2)]
[im 1/23]
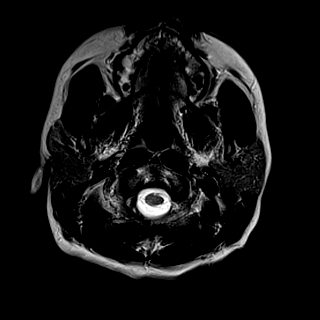
[im 23/23]
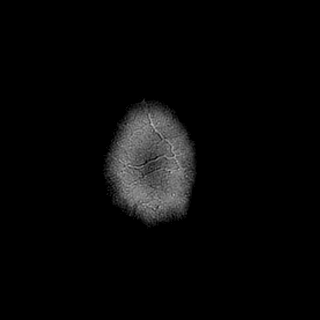

[Series 9: FLAIR · axial · 3.0mm · 0.79mm/px · z∈[-11,+127]mm · 4 of 47 slices shown]
[im 1/47]
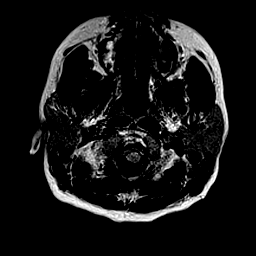
[im 16/47]
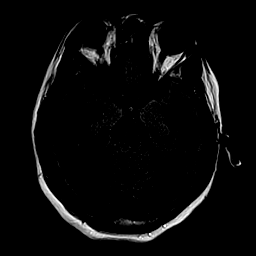
[im 31/47]
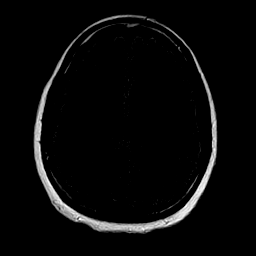
[im 47/47]
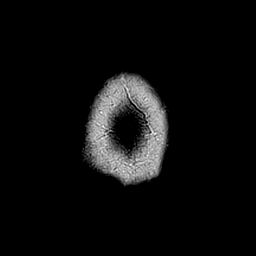

[Series 12: T2 · coronal · 3.0mm · 0.19mm/px · 2 of 36 slices shown (2 of 2)]
[im 1/36]
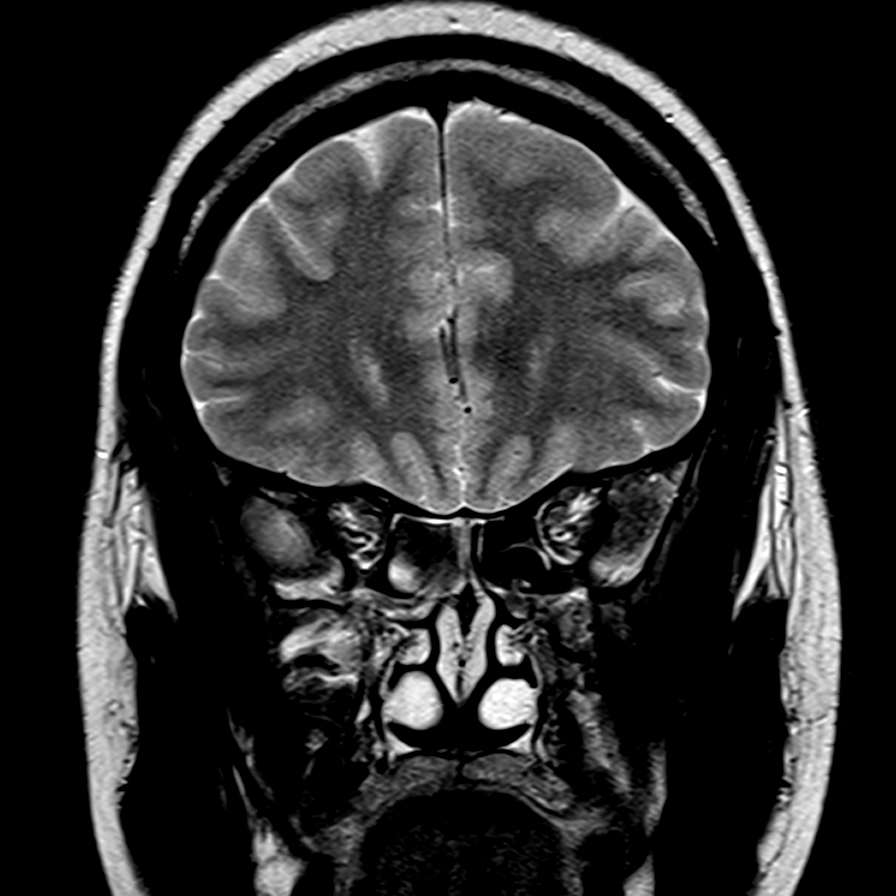
[im 18/36]
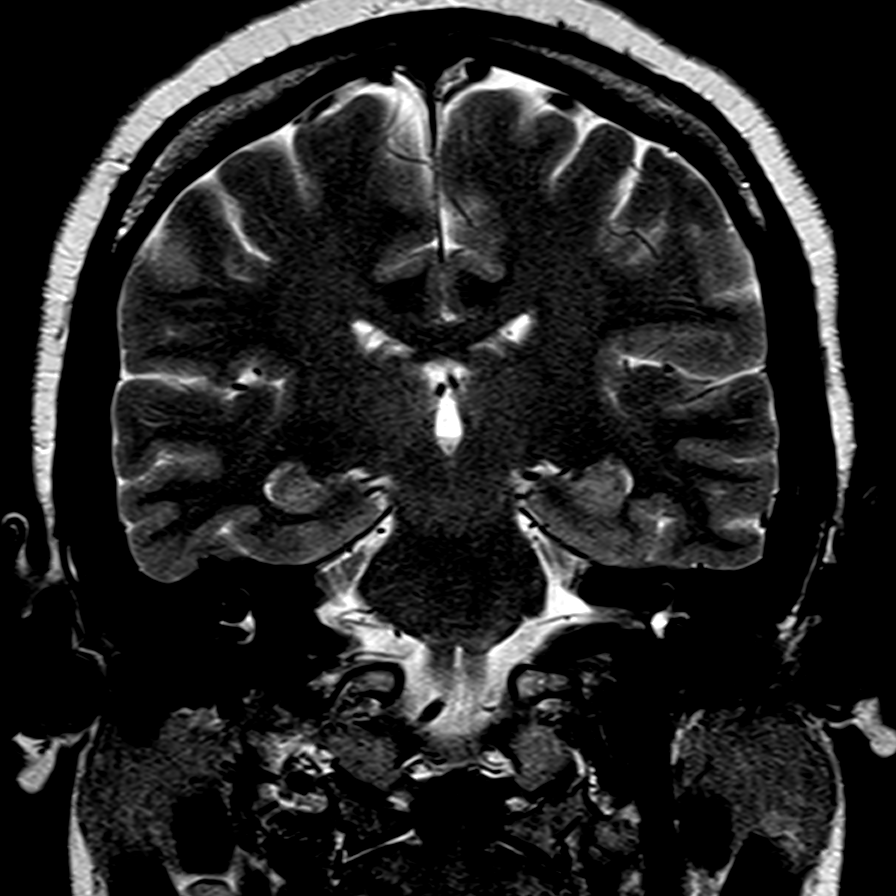

[Series 15: T1 post-contrast · coronal · 5.0mm · 0.37mm/px · 3 of 28 slices shown]
[im 1/28]
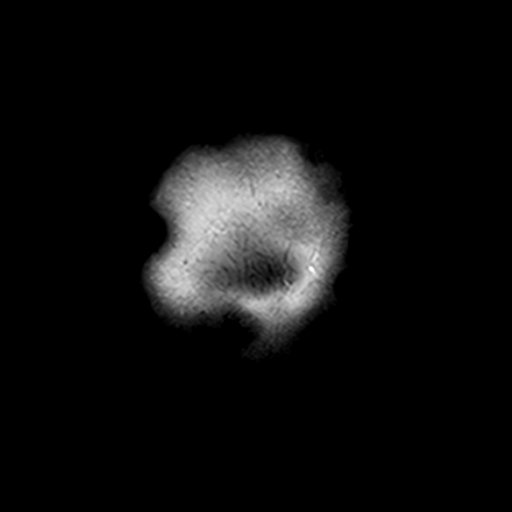
[im 14/28]
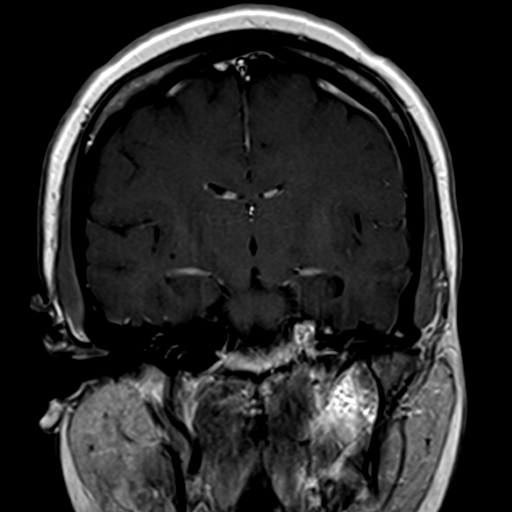
[im 28/28]
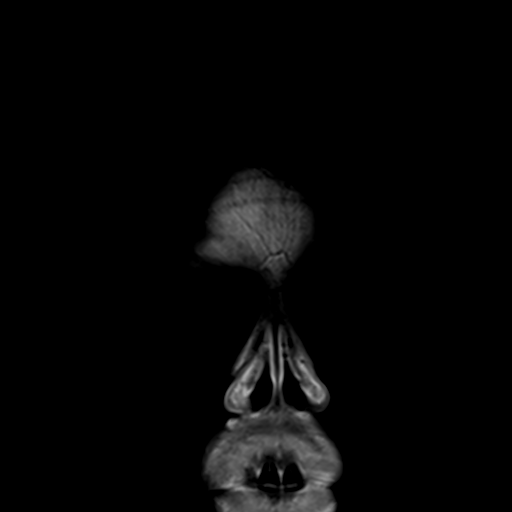

[27 of 48 positions shown; findings below may reference images not displayed]

FINDINGS: Brain: Brain has normal appearance without evidence of malformation,
atrophy, old or acute small or large vessel infarction, mass lesion,
hemorrhage, hydrocephalus or extra-axial collection. After contrast
administration, no abnormal enhancement occurs. Mesial temporal
lobes appear normal and symmetric.

Vascular: Major vessels at the base of the brain show flow. Venous
sinuses appear patent.

Skull and upper cervical spine: Normal.

Sinuses/Orbits: Mild seasonal mucosal thickening of the sinuses. No
advanced sinusitis. Orbits negative.

Other: None significant.
IMPRESSION: Normal intracranial examination. No cause of headache or seizure
identified.

Mild mucosal thickening of the paranasal sinuses, likely seasonal
and probably not significant otherwise.

## 2019-08-31 ENCOUNTER — Emergency Department (HOSPITAL_COMMUNITY): Payer: BC Managed Care – PPO

## 2019-08-31 ENCOUNTER — Encounter (HOSPITAL_COMMUNITY): Payer: Self-pay | Admitting: *Deleted

## 2019-08-31 ENCOUNTER — Emergency Department (HOSPITAL_COMMUNITY)
Admission: EM | Admit: 2019-08-31 | Discharge: 2019-08-31 | Disposition: A | Discharge status: Home / Self Care | Payer: BC Managed Care – PPO | Attending: Emergency Medicine | Admitting: Emergency Medicine

## 2019-08-31 ENCOUNTER — Other Ambulatory Visit: Payer: Self-pay

## 2019-08-31 DIAGNOSIS — I10 Essential (primary) hypertension: Secondary | ICD-10-CM | POA: Diagnosis not present

## 2019-08-31 DIAGNOSIS — M79602 Pain in left arm: Secondary | ICD-10-CM | POA: Diagnosis not present

## 2019-08-31 DIAGNOSIS — M542 Cervicalgia: Secondary | ICD-10-CM | POA: Insufficient documentation

## 2019-08-31 DIAGNOSIS — R0602 Shortness of breath: Secondary | ICD-10-CM | POA: Insufficient documentation

## 2019-08-31 DIAGNOSIS — R0789 Other chest pain: Secondary | ICD-10-CM

## 2019-08-31 DIAGNOSIS — R079 Chest pain, unspecified: Secondary | ICD-10-CM | POA: Diagnosis present

## 2019-08-31 DIAGNOSIS — Z79899 Other long term (current) drug therapy: Secondary | ICD-10-CM | POA: Insufficient documentation

## 2019-08-31 DIAGNOSIS — R202 Paresthesia of skin: Secondary | ICD-10-CM | POA: Diagnosis not present

## 2019-08-31 DIAGNOSIS — J45909 Unspecified asthma, uncomplicated: Secondary | ICD-10-CM | POA: Diagnosis not present

## 2019-08-31 LAB — TROPONIN I (HIGH SENSITIVITY)
Troponin I (High Sensitivity): 3 ng/L (ref <18)
Troponin I (High Sensitivity): 2 ng/L (ref <18)

## 2019-08-31 LAB — BASIC METABOLIC PANEL
Anion gap: 9 (ref 5–15)
BUN: 12 mg/dL (ref 6–20)
CO2: 24 mmol/L (ref 22–32)
Calcium: 8.8 mg/dL — ABNORMAL LOW (ref 8.9–10.3)
Chloride: 106 mmol/L (ref 98–111)
Creatinine, Ser: 0.83 mg/dL (ref 0.44–1.00)
GFR calc Af Amer: >60 mL/min (ref >60)
GFR calc non Af Amer: >60 mL/min (ref >60)
Glucose, Bld: 115 mg/dL — ABNORMAL HIGH (ref 70–99)
Potassium: 3.7 mmol/L (ref 3.5–5.1)
Sodium: 139 mmol/L (ref 135–145)

## 2019-08-31 LAB — CBC WITH DIFFERENTIAL/PLATELET
Abs Immature Granulocytes: 0 10*3/uL (ref 0.00–0.07)
Basophils Absolute: 0 10*3/uL (ref 0.0–0.1)
Basophils Relative: 1 %
Eosinophils Absolute: 0.1 10*3/uL (ref 0.0–0.5)
Eosinophils Relative: 2 %
HCT: 38.3 % (ref 36.0–46.0)
Hemoglobin: 11.8 g/dL — ABNORMAL LOW (ref 12.0–15.0)
Immature Granulocytes: 0 %
Lymphocytes Relative: 54 %
Lymphs Abs: 2.3 10*3/uL (ref 0.7–4.0)
MCH: 28.2 pg (ref 26.0–34.0)
MCHC: 30.8 g/dL (ref 30.0–36.0)
MCV: 91.4 fL (ref 80.0–100.0)
Monocytes Absolute: 0.3 10*3/uL (ref 0.1–1.0)
Monocytes Relative: 7 %
Neutro Abs: 1.5 10*3/uL — ABNORMAL LOW (ref 1.7–7.7)
Neutrophils Relative %: 36 %
Platelets: 298 10*3/uL (ref 150–400)
RBC: 4.19 MIL/uL (ref 3.87–5.11)
RDW: 13 % (ref 11.5–15.5)
WBC: 4.1 10*3/uL (ref 4.0–10.5)
nRBC: 0.5 % — ABNORMAL HIGH (ref 0.0–0.2)

## 2019-08-31 LAB — D-DIMER, QUANTITATIVE: D-Dimer, Quant: <0.27 ug/mL-FEU (ref 0.00–0.50)

## 2019-08-31 MED ORDER — KETOROLAC TROMETHAMINE 30 MG/ML IJ SOLN
INTRAMUSCULAR | Status: AC
  Filled 2019-08-31: qty 1

## 2019-08-31 MED ORDER — OMEPRAZOLE 20 MG PO CPDR
20.0000 mg | DELAYED_RELEASE_CAPSULE | Freq: Every day | ORAL | 0 refills | Status: DC
Start: 2019-08-31 — End: 2023-10-14

## 2019-08-31 MED ORDER — IBUPROFEN 600 MG PO TABS
600.0000 mg | ORAL_TABLET | Freq: Four times a day (QID) | ORAL | 0 refills | Status: DC | PRN
Start: 2019-08-31 — End: 2023-10-14

## 2019-08-31 MED ORDER — LIDOCAINE VISCOUS HCL 2 % MT SOLN
OROMUCOSAL | Status: AC
  Filled 2019-08-31: qty 15

## 2019-08-31 MED ORDER — KETOROLAC TROMETHAMINE 30 MG/ML IJ SOLN
30.0000 mg | Freq: Once | INTRAMUSCULAR | Status: DC
Start: 2019-08-31 — End: 2019-08-31

## 2019-08-31 MED ORDER — ALUM & MAG HYDROXIDE-SIMETH 200-200-20 MG/5ML PO SUSP
ORAL | Status: AC
  Filled 2019-08-31: qty 30

## 2019-08-31 MED ORDER — ALUM & MAG HYDROXIDE-SIMETH 200-200-20 MG/5ML PO SUSP
30.0000 mL | Freq: Once | ORAL | Status: AC
  Administered 2019-08-31: 05:00:00 30 mL via ORAL

## 2019-08-31 MED ORDER — KETOROLAC TROMETHAMINE 30 MG/ML IJ SOLN
30.0000 mg | Freq: Once | INTRAMUSCULAR | Status: AC
  Administered 2019-08-31: 30 mg via INTRAMUSCULAR

## 2019-08-31 MED ORDER — LIDOCAINE VISCOUS HCL 2 % MT SOLN
15.0000 mL | Freq: Once | OROMUCOSAL | Status: AC
  Administered 2019-08-31: 05:00:00 15 mL via ORAL

## 2019-08-31 NOTE — Discharge Instructions (Signed)
There is no evidence of heart attack or blood clot in the lung.  Follow-up with your primary doctor.  Take the stomach medication as prescribed.  Avoid alcohol, NSAIDs, caffeine, spicy foods return to the ED if your chest pain becomes exertional, associated shortness of breath, nausea, vomiting, sweating, other concerns.

## 2019-08-31 NOTE — ED Provider Notes (Signed)
[facility] EMERGENCY DEPARTMENT Provider Note   CSN: [id number] Arrival date & time: 08/31/19  0032     History Chief Complaint  Patient presents with  . Chest Pain    Nancy Powell is a 39 y.o. female.  Patient with history of asthma and hypertension here with central chest pain onset about 6 PM when she got home from work.  Pain is in the center of her chest that radiates to her neck and her arm.  The pain is fairly constant.  She attempted to take Alka-Seltzer at home as well as ibuprofen without relief.  Nothing makes the pain better or worse.  She had some numbness and pain in her left arm which is constant.  She denies any shortness of breath, nausea, vomiting, cough, fever.  No leg pain or leg swelling.  She is never had this kind of pain in the past.  No history of any heart problems.  She believes her asthma is under well control.  Denies any possibility of pregnancy.  Not use birth control. Pain is slightly worse when she moves her left arm.  The history is provided by the patient.  Chest Pain Associated symptoms: shortness of breath   Associated symptoms: no abdominal pain, no dizziness, no fever, no headache, no nausea, no vomiting and no weakness        Past Medical History:  Diagnosis Date  . Asthma   . Hypertension     There are no problems to display for this patient.   Past Surgical History:  Procedure Laterality Date  . ABDOMINAL HYSTERECTOMY     partial 2014     OB History   No obstetric history on file.     No family history on file.  Social History   Tobacco Use  . Smoking status: Never Smoker  . Smokeless tobacco: Never Used  Substance Use Topics  . Alcohol use: No  . Drug use: No    Home Medications Prior to Admission medications   Medication Sig Start Date End Date Taking? Authorizing Provider  hydrochlorothiazide (HYDRODIURIL) 12.5 MG tablet Take 1 tablet by mouth daily. 02/17/18   [provider]  ibuprofen  (ADVIL,MOTRIN) 800 MG tablet Take 1 tablet (800 mg total) by mouth every 8 (eight) hours as needed. Patient not taking: Reported on 06/03/2018 09/01/14   Horton, Mayer Masker, MD  losartan (COZAAR) 50 MG tablet Take 1 tablet by mouth daily. 05/20/18   [provider]  mometasone (ASMANEX) 220 MCG/INH inhaler Inhale 1-2 puffs into the lungs daily.    [provider]  naproxen (NAPROSYN) 500 MG tablet Take 1 tablet (500 mg total) by mouth 2 (two) times daily. Patient not taking: Reported on 06/03/2018 01/14/14   Azalia Bilis, MD  pantoprazole (PROTONIX) 20 MG tablet Take 1 tablet (20 mg total) by mouth daily. 06/03/18   Bethann Berkshire, MD  rizatriptan (MAXALT) 10 MG tablet Take 10 mg by mouth as needed for migraine. May repeat in 2 hours if needed    [provider]  topiramate (TOPAMAX) 25 MG tablet Take 1 tablet by mouth 2 (two) times daily. 02/23/18   [provider]    Allergies    Patient has no known allergies.  Review of Systems   Review of Systems  Constitutional: Negative for activity change, appetite change and fever.  HENT: Negative for congestion and rhinorrhea.   Respiratory: Positive for chest tightness and shortness of breath.   Cardiovascular: Positive for chest pain. Negative  for leg swelling.  Gastrointestinal: Negative for abdominal pain, nausea and vomiting.  Genitourinary: Negative for dysuria and hematuria.  Musculoskeletal: Negative for arthralgias and myalgias.  Skin: Negative for rash.  Neurological: Negative for dizziness, weakness, light-headedness and headaches.   all other systems are negative except as noted in the HPI and PMH.    Physical Exam Updated Vital Signs BP (!) 143/102   Pulse 63   Temp 98.8 F (37.1 C)   Resp 18   Ht 5\' 5"  (1.651 m)   Wt 83.9 kg   LMP 09/01/2012 Comment: Partial Hysterectomy  SpO2 100%   BMI 30.79 kg/m   Physical Exam Vitals and nursing note reviewed.  Constitutional:      General: She is  not in acute distress.    Appearance: She is well-developed.  HENT:     Head: Normocephalic and atraumatic.     Mouth/Throat:     Pharynx: No oropharyngeal exudate.  Eyes:     Conjunctiva/sclera: Conjunctivae normal.     Pupils: Pupils are equal, round, and reactive to light.  Neck:     Comments: No meningismus. Cardiovascular:     Rate and Rhythm: Normal rate and regular rhythm.     Heart sounds: Normal heart sounds. No murmur heard.   Pulmonary:     Effort: Pulmonary effort is normal. No respiratory distress.     Breath sounds: Normal breath sounds.     Comments: Chest pain somewhat reproducible. Chest:     Chest wall: Tenderness present.  Abdominal:     Palpations: Abdomen is soft.     Tenderness: There is no abdominal tenderness. There is no guarding or rebound.  Musculoskeletal:        General: Tenderness present. Normal range of motion.     Cervical back: Normal range of motion and neck supple.     Comments: Tenderness of left paraspinal cervical muscles and trapezius. Pain with range of motion of left arm. Equal radial pulses and grip  Skin:    General: Skin is warm.  Neurological:     Mental Status: She is alert and oriented to person, place, and time.     Cranial Nerves: No cranial nerve deficit.     Motor: No abnormal muscle tone.     Coordination: Coordination normal.     Comments:  5/5 strength throughout. CN 2-12 intact.Equal grip strength.   Psychiatric:        Behavior: Behavior normal.     ED Results / Procedures / Treatments   Labs (all labs ordered are listed, but only abnormal results are displayed) Labs Reviewed  CBC WITH DIFFERENTIAL/PLATELET - Abnormal; Notable for the following components:      Result Value   Hemoglobin 11.8 (*)    nRBC 0.5 (*)    Neutro Abs 1.5 (*)    All other components within normal limits  BASIC METABOLIC PANEL - Abnormal; Notable for the following components:   Glucose, Bld 115 (*)    Calcium 8.8 (*)    All other  components within normal limits  D-DIMER, QUANTITATIVE (NOT AT [facility])  TROPONIN I (HIGH SENSITIVITY)  TROPONIN I (HIGH SENSITIVITY)    EKG EKG Interpretation  Date/Time:  Tuesday August 31 2019 00:42:57 EDT Ventricular Rate:  69 PR Interval:  190 QRS Duration: 78 QT Interval:  404 QTC Calculation: 432 R Axis:   87 Text Interpretation: Normal sinus rhythm Normal ECG No significant change was found Confirmed by [id number] [phone]) on 08/31/2019 1:09:01 AM  Radiology DG Chest 2 View  Result Date: 08/31/2019 CLINICAL DATA:  Chest pain.  Mid chest pain radiating to the left. EXAM: CHEST - 2 VIEW COMPARISON:  06/03/2018 FINDINGS: The cardiomediastinal contours are normal. The lungs are clear. Pulmonary vasculature is normal. No consolidation, pleural effusion, or pneumothorax. No acute osseous abnormalities are seen. IMPRESSION: Negative radiographs of the chest. Electronically Signed   By: Narda Rutherford M.D.   On: 08/31/2019 01:51    Procedures Procedures (including critical care time)  Medications Ordered in ED Medications  alum & mag hydroxide-simeth (MAALOX/MYLANTA) 200-200-20 MG/5ML suspension 30 mL (has no administration in time range)    And  lidocaine (XYLOCAINE) 2 % viscous mouth solution 15 mL (has no administration in time range)  ketorolac (TORADOL) 30 MG/ML injection 30 mg (has no administration in time range)    ED Course  I have reviewed the triage vital signs and the nursing notes.  Pertinent labs & imaging results that were available during my care of the patient were reviewed by me and considered in my medical decision making (see chart for details).    MDM Rules/Calculators/A&P                         Ongoing chest pain since 6 PM.  EKG is sinus rhythm.  Low suspicion for ACS or PE.  Troponin negative and D-dimer negative.  Chest x-ray negative  Pain is improved after Toradol and GI cocktail.  Troponins remain negative after almost 12 hours of pain.   Pain is somewhat reproducible with palpation and movement of the arm.  We will treat supportively with PPI.  Avoid alcohol, NSAIDs, caffeine, spicy foods. Follow-up with primary doctor.  Return to the ED if chest pain becomes exertional, associated shortness of breath, nausea, vomiting, diaphoresis, or other concerns Final Clinical Impression(s) / ED Diagnoses Final diagnoses:  None    Rx / DC Orders ED Discharge Orders    None       Janelli Welling, Jeannett Senior, MD 08/31/19 [phone]

## 2019-08-31 NOTE — ED Triage Notes (Signed)
Pt c/o mid center chest pain that radiates to left neck and left arm that started today. Denies any n/v, admits to sob that she noticed a few days ago,

## 2020-05-02 IMAGING — CR PORTABLE CHEST - 1 VIEW
1 series · 1 of 1 positions shown · non-contrast
Comparison: 09/06/2013

CLINICAL DATA: 37-year-old female with a history of chest pain

EXAM:
PORTABLE CHEST 1 VIEW

[portable]
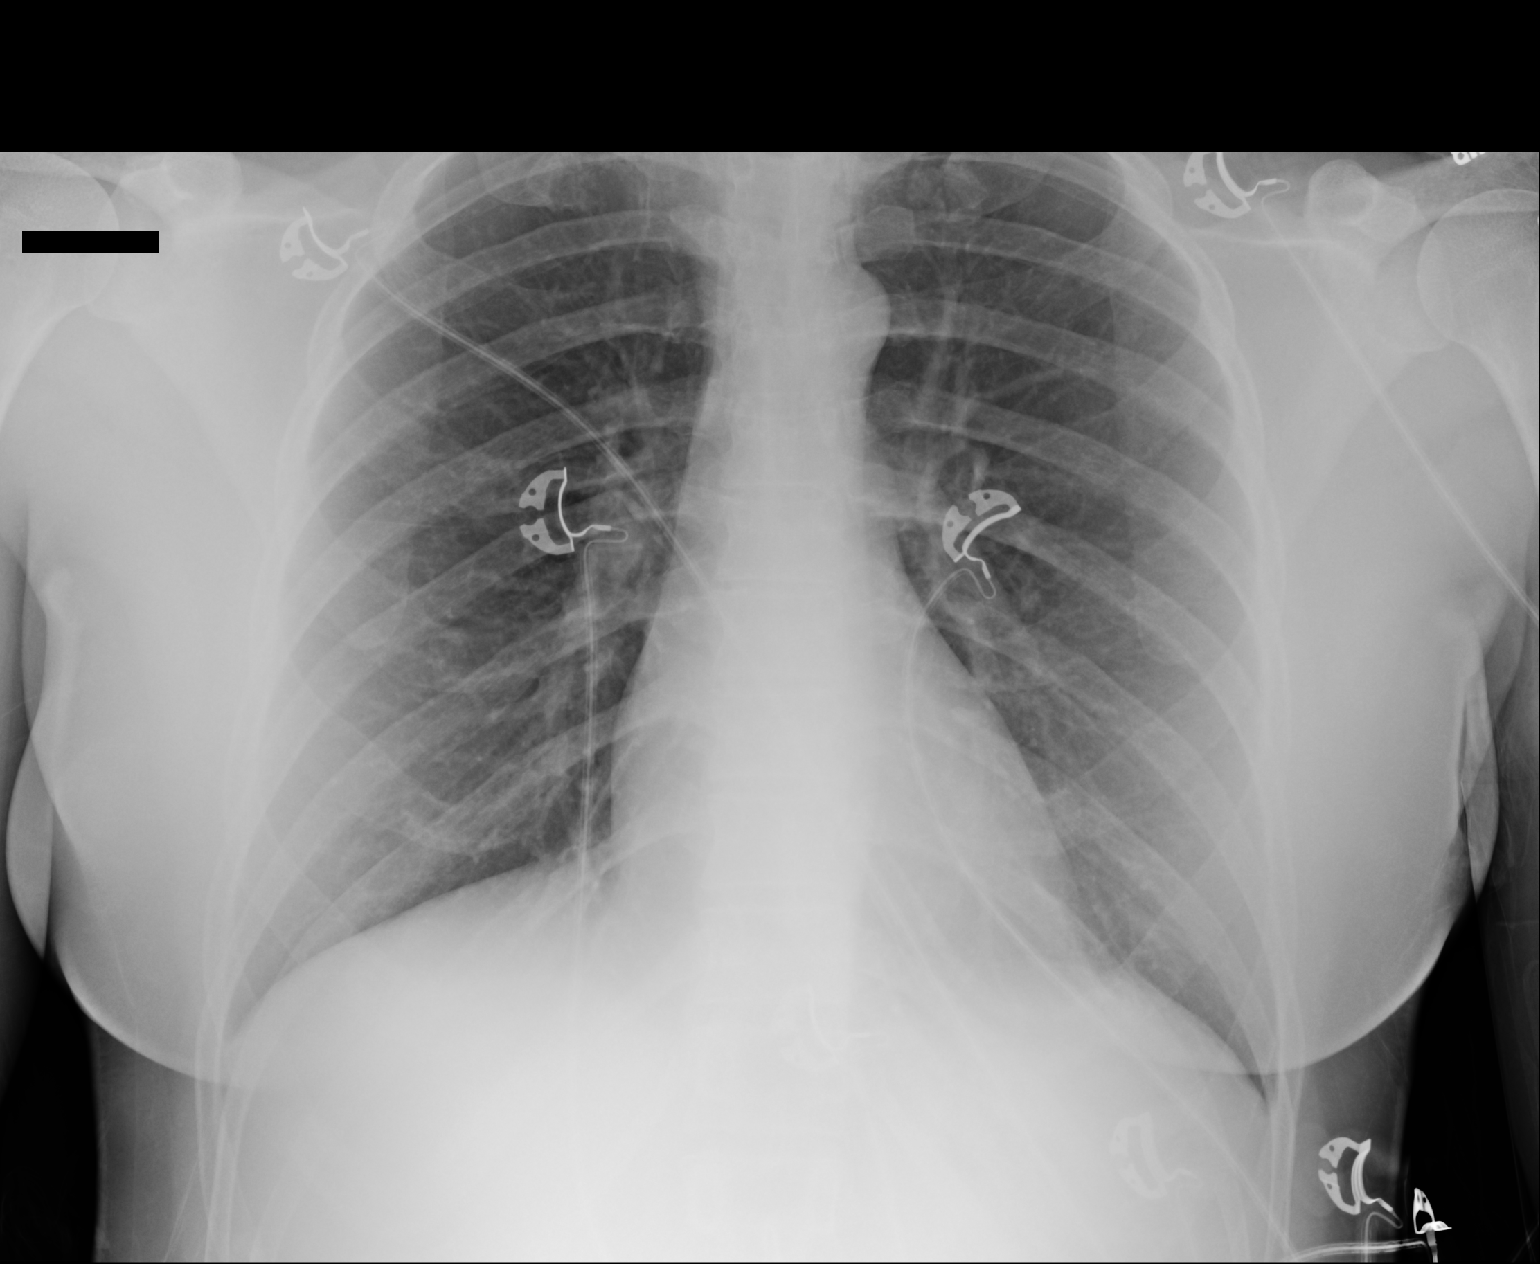

[1 of 1 positions shown; findings below may reference images not displayed]

FINDINGS: The heart size and mediastinal contours are within normal limits.
Both lungs are clear. The visualized skeletal structures are
unremarkable.
IMPRESSION: Negative for acute cardiopulmonary disease

## 2020-11-13 DIAGNOSIS — U071 COVID-19: Secondary | ICD-10-CM | POA: Diagnosis not present

## 2020-11-13 DIAGNOSIS — Z20822 Contact with and (suspected) exposure to covid-19: Secondary | ICD-10-CM | POA: Diagnosis not present

## 2021-04-04 DIAGNOSIS — Z23 Encounter for immunization: Secondary | ICD-10-CM | POA: Diagnosis not present

## 2021-04-04 DIAGNOSIS — J4520 Mild intermittent asthma, uncomplicated: Secondary | ICD-10-CM | POA: Diagnosis not present

## 2021-04-04 DIAGNOSIS — Z0001 Encounter for general adult medical examination with abnormal findings: Secondary | ICD-10-CM | POA: Diagnosis not present

## 2021-04-04 DIAGNOSIS — I10 Essential (primary) hypertension: Secondary | ICD-10-CM | POA: Diagnosis not present

## 2021-04-04 DIAGNOSIS — F411 Generalized anxiety disorder: Secondary | ICD-10-CM | POA: Diagnosis not present

## 2021-04-12 ENCOUNTER — Other Ambulatory Visit (HOSPITAL_COMMUNITY)
Admission: RE | Admit: 2021-04-12 | Discharge: 2021-04-12 | Disposition: A | Discharge status: Home / Self Care | Payer: BC Managed Care – PPO | Source: Ambulatory Visit | Attending: Internal Medicine | Admitting: Internal Medicine

## 2021-04-12 DIAGNOSIS — Z0001 Encounter for general adult medical examination with abnormal findings: Secondary | ICD-10-CM | POA: Diagnosis not present

## 2021-04-12 LAB — CBC WITH DIFFERENTIAL/PLATELET
Abs Immature Granulocytes: 0.01 10*3/uL (ref 0.00–0.07)
Basophils Absolute: 0 10*3/uL (ref 0.0–0.1)
Basophils Relative: 1 %
Eosinophils Absolute: 0.1 10*3/uL (ref 0.0–0.5)
Eosinophils Relative: 1 %
HCT: 40.3 % (ref 36.0–46.0)
Hemoglobin: 12.6 g/dL (ref 12.0–15.0)
Immature Granulocytes: 0 %
Lymphocytes Relative: 49 %
Lymphs Abs: 2.2 10*3/uL (ref 0.7–4.0)
MCH: 27.9 pg (ref 26.0–34.0)
MCHC: 31.3 g/dL (ref 30.0–36.0)
MCV: 89.2 fL (ref 80.0–100.0)
Monocytes Absolute: 0.4 10*3/uL (ref 0.1–1.0)
Monocytes Relative: 8 %
Neutro Abs: 1.9 10*3/uL (ref 1.7–7.7)
Neutrophils Relative %: 41 %
Platelets: 275 10*3/uL (ref 150–400)
RBC: 4.52 MIL/uL (ref 3.87–5.11)
RDW: 12.2 % (ref 11.5–15.5)
WBC: 4.6 10*3/uL (ref 4.0–10.5)
nRBC: 0 % (ref 0.0–0.2)

## 2021-04-12 LAB — HEMOGLOBIN A1C
Hgb A1c MFr Bld: 5 % (ref 4.8–5.6)
Mean Plasma Glucose: 96.8 mg/dL

## 2021-04-12 LAB — BASIC METABOLIC PANEL
Anion gap: 6 (ref 5–15)
BUN: 14 mg/dL (ref 6–20)
CO2: 26 mmol/L (ref 22–32)
Calcium: 9 mg/dL (ref 8.9–10.3)
Chloride: 99 mmol/L (ref 98–111)
Creatinine, Ser: 0.99 mg/dL (ref 0.44–1.00)
GFR, Estimated: >60 mL/min (ref >60)
Glucose, Bld: 96 mg/dL (ref 70–99)
Potassium: 3.5 mmol/L (ref 3.5–5.1)
Sodium: 131 mmol/L — ABNORMAL LOW (ref 135–145)

## 2021-04-12 LAB — LIPID PANEL
Cholesterol: 160 mg/dL (ref 0–200)
HDL: 47 mg/dL (ref >40)
LDL Cholesterol: 96 mg/dL (ref 0–99)
Total CHOL/HDL Ratio: 3.4 RATIO
Triglycerides: 86 mg/dL (ref <150)
VLDL: 17 mg/dL (ref 0–40)

## 2021-04-12 LAB — HEPATIC FUNCTION PANEL
ALT: 20 U/L (ref 0–44)
AST: 16 U/L (ref 15–41)
Albumin: 4.3 g/dL (ref 3.5–5.0)
Alkaline Phosphatase: 40 U/L (ref 38–126)
Bilirubin, Direct: 0.1 mg/dL (ref 0.0–0.2)
Indirect Bilirubin: 0.3 mg/dL (ref 0.3–0.9)
Total Bilirubin: 0.4 mg/dL (ref 0.3–1.2)
Total Protein: 7.8 g/dL (ref 6.5–8.1)

## 2021-04-12 LAB — TSH: TSH: 0.689 u[IU]/mL (ref 0.350–4.500)

## 2021-04-13 LAB — T4: T4, Total: 7.4 ug/dL (ref 4.5–12.0)

## 2021-07-18 DIAGNOSIS — K219 Gastro-esophageal reflux disease without esophagitis: Secondary | ICD-10-CM | POA: Diagnosis not present

## 2021-07-18 DIAGNOSIS — I10 Essential (primary) hypertension: Secondary | ICD-10-CM | POA: Diagnosis not present

## 2021-07-18 DIAGNOSIS — F419 Anxiety disorder, unspecified: Secondary | ICD-10-CM | POA: Diagnosis not present

## 2021-07-18 DIAGNOSIS — J4520 Mild intermittent asthma, uncomplicated: Secondary | ICD-10-CM | POA: Diagnosis not present

## 2021-07-30 IMAGING — DX DG CHEST 2V
2 series · 2 of 2 positions shown · non-contrast
Comparison: 06/03/2018

CLINICAL DATA: Chest pain.  Mid chest pain radiating to the left.

EXAM:
CHEST - 2 VIEW

[chest pa]
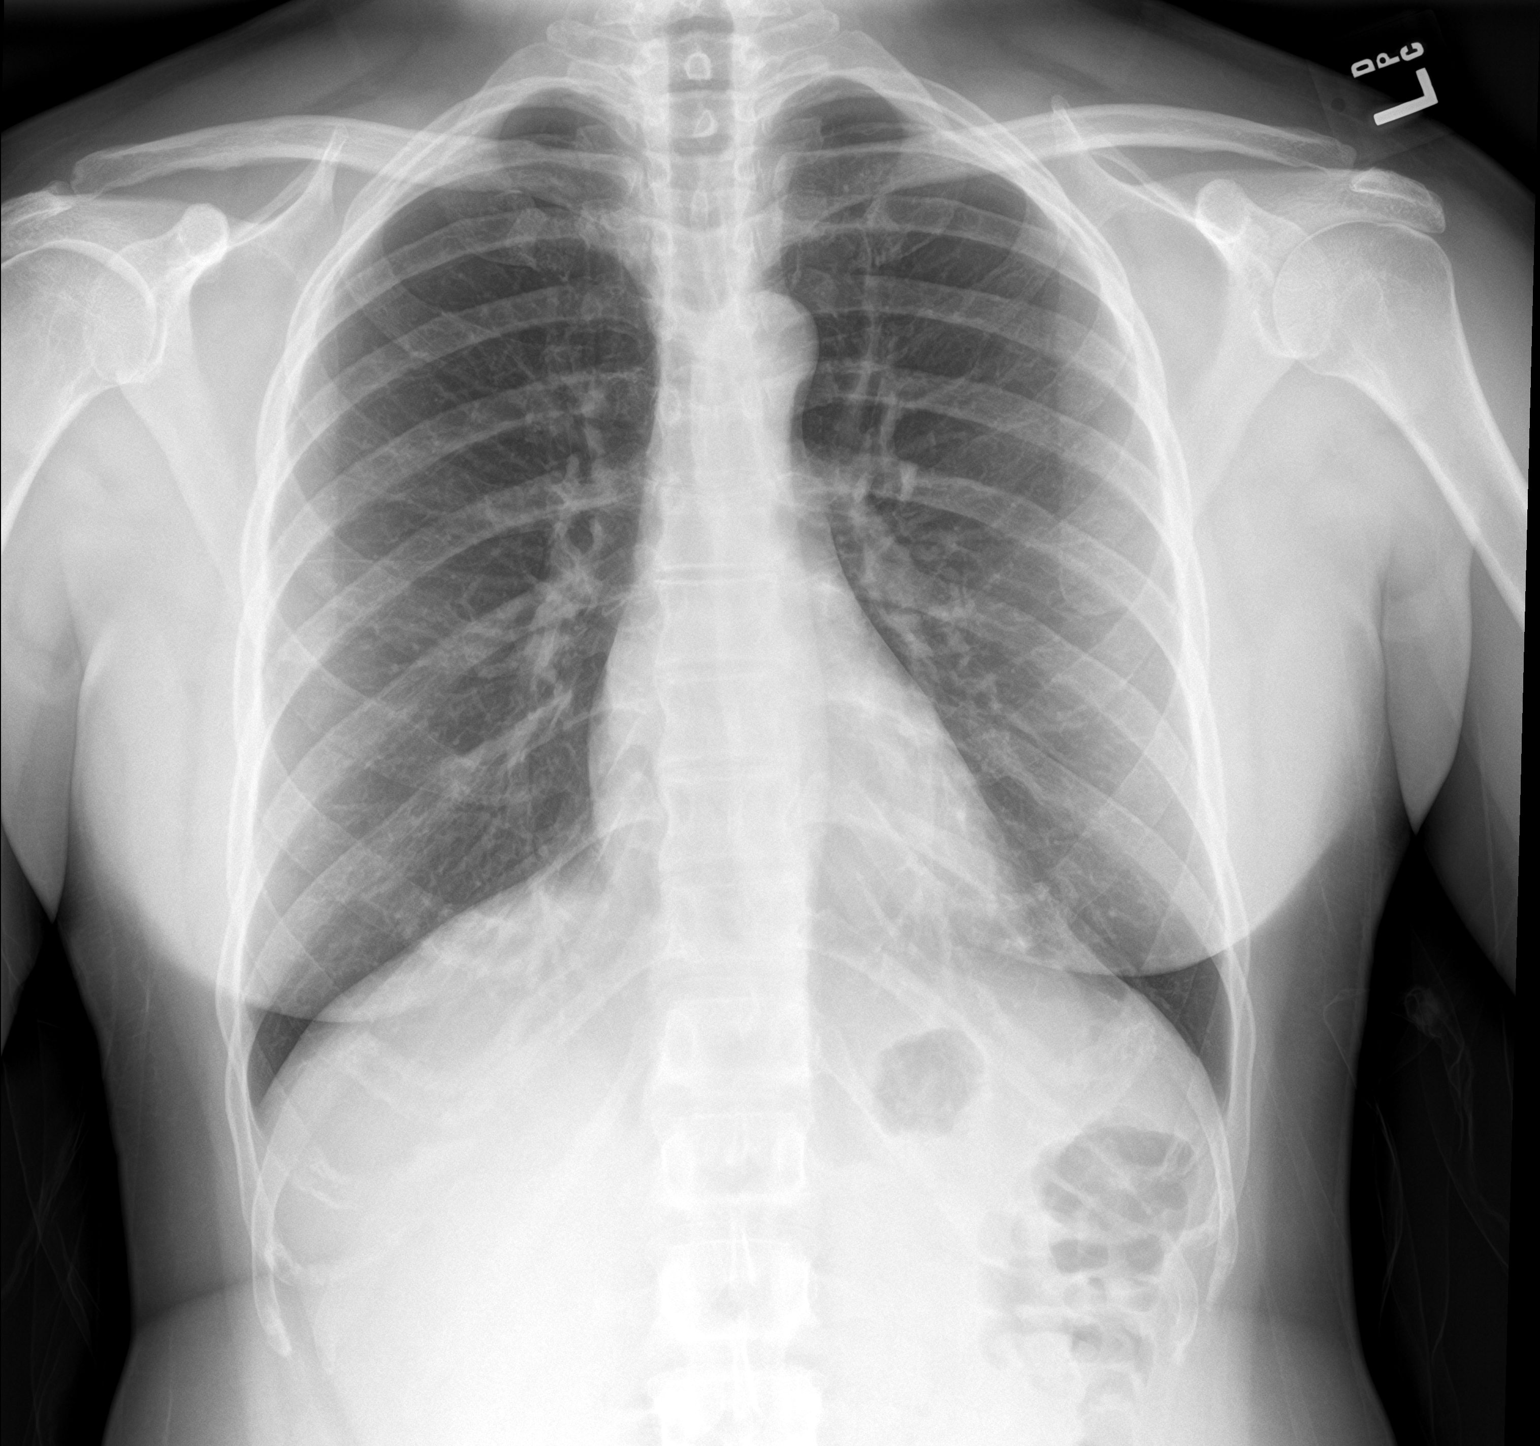

[chest lat]
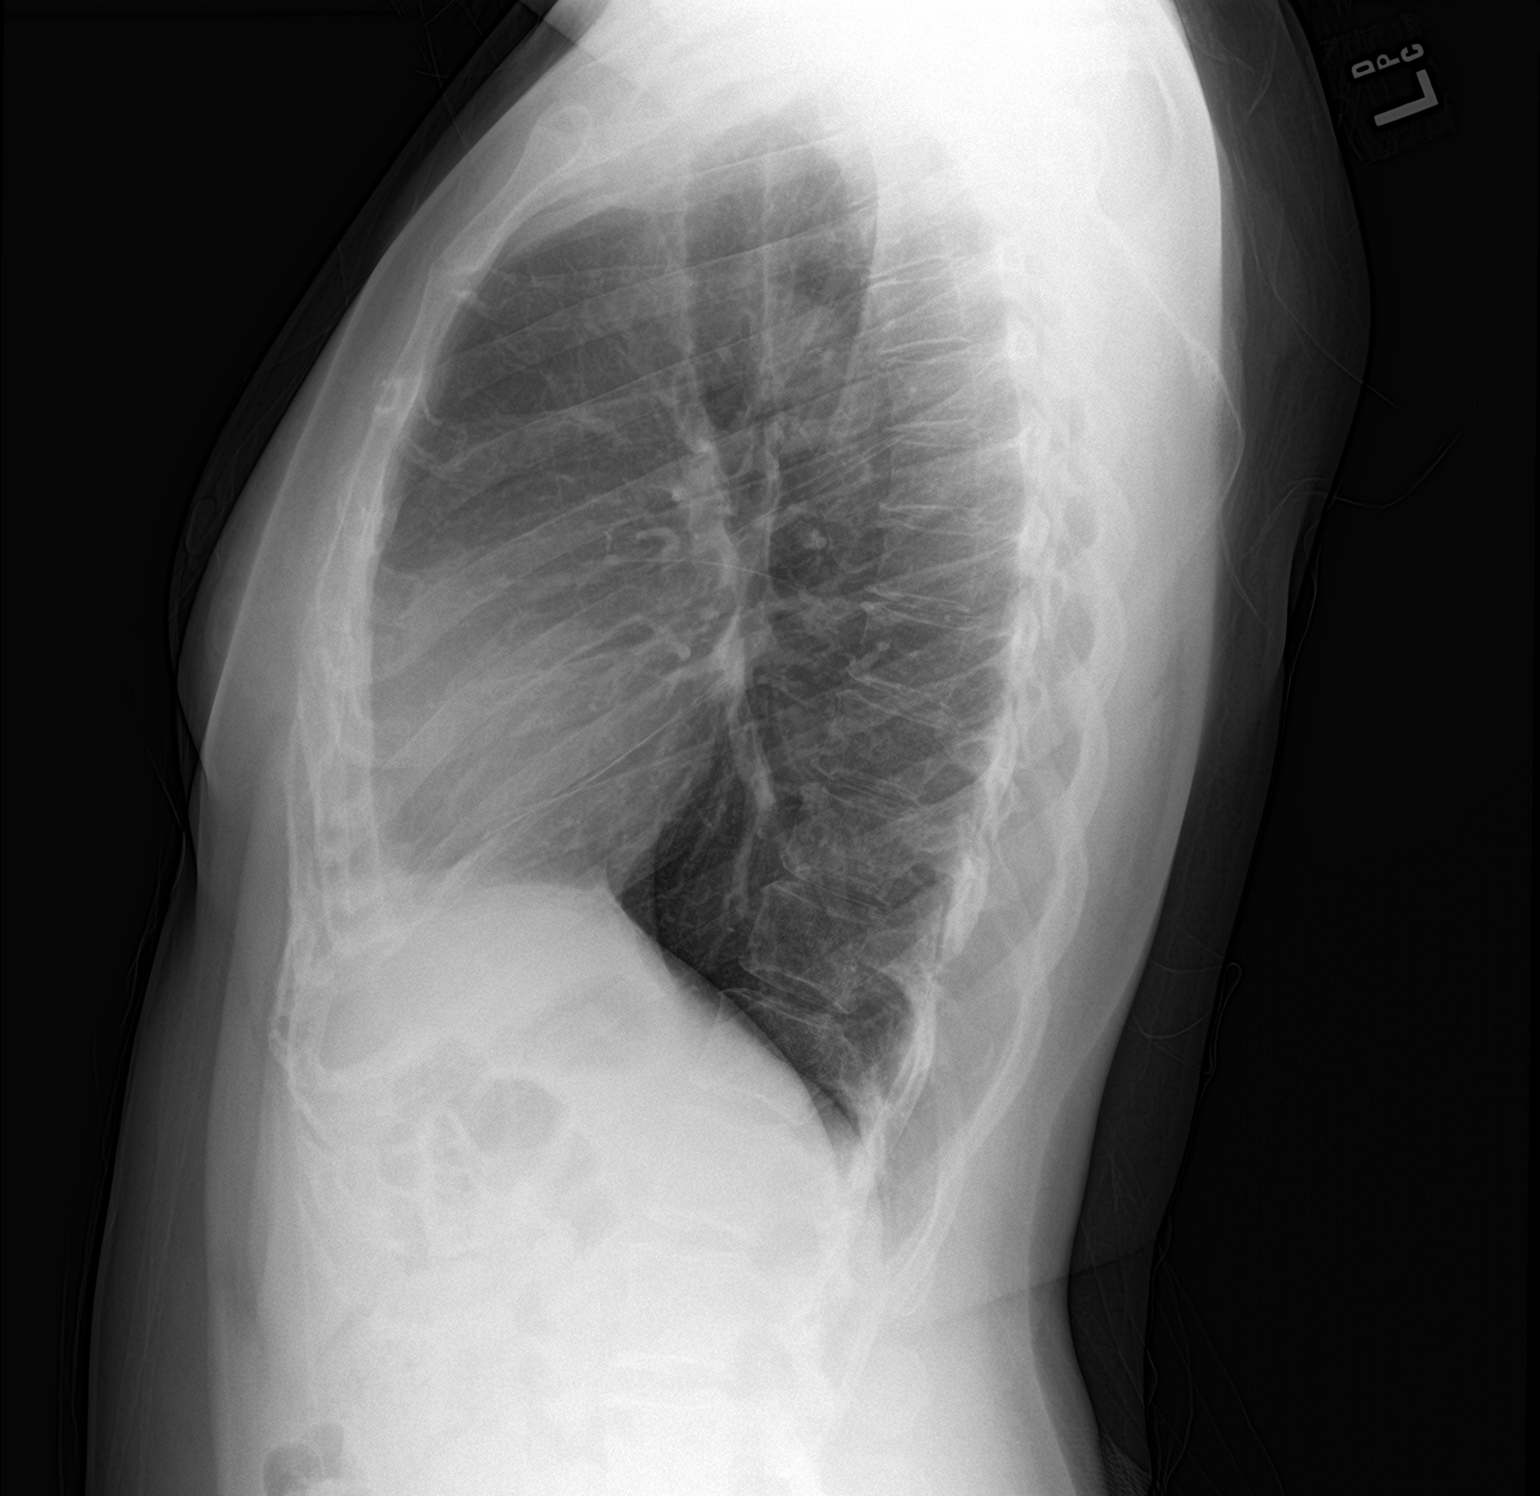

[2 of 2 positions shown; findings below may reference images not displayed]

FINDINGS: The cardiomediastinal contours are normal. The lungs are clear.
Pulmonary vasculature is normal. No consolidation, pleural effusion,
or pneumothorax. No acute osseous abnormalities are seen.
IMPRESSION: Negative radiographs of the chest.

## 2021-09-19 ENCOUNTER — Ambulatory Visit
Admission: EM | Admit: 2021-09-19 | Discharge: 2021-09-19 | Disposition: A | Discharge status: Home / Self Care | Payer: BC Managed Care – PPO | Attending: Family Medicine | Admitting: Family Medicine

## 2021-09-19 ENCOUNTER — Encounter: Payer: Self-pay | Admitting: Emergency Medicine

## 2021-09-19 DIAGNOSIS — S39012A Strain of muscle, fascia and tendon of lower back, initial encounter: Secondary | ICD-10-CM | POA: Diagnosis not present

## 2021-09-19 MED ORDER — KETOROLAC TROMETHAMINE 30 MG/ML IJ SOLN
30.0000 mg | Freq: Once | INTRAMUSCULAR | Status: AC
  Administered 2021-09-19: 30 mg via INTRAMUSCULAR

## 2021-09-19 MED ORDER — CYCLOBENZAPRINE HCL 10 MG PO TABS: 10.0000 mg | ORAL_TABLET | Freq: Three times a day (TID) | ORAL | 0 refills | Status: DC | PRN

## 2021-09-19 NOTE — ED Triage Notes (Signed)
Left sided flank and back pain after trying to hold a door at work yesterday.  Pain is worse when standing.

## 2021-09-19 NOTE — ED Provider Notes (Signed)
[facility]    CSN: [id number] Arrival date & time: 09/19/21  0859      History   Chief Complaint No chief complaint on file.   HPI Nancy Powell is a 41 y.o. female.   Presenting today with left-sided low back pain extending to her abdominal muscles after trying to hold a heavy door open at work yesterday.  She states she had some initial soreness but the symptoms worsened as the day progressed.  She has worsening pain and stiffness with movement, particular twisting and bending at the waist.  Denies midline pain, radiation of pain down legs, numbness, weakness, tingling, bowel or bladder incontinence, saddle anesthesias.  Took some aspirin yesterday with minimal relief.    Past Medical History:  Diagnosis Date   Asthma    Hypertension     There are no problems to display for this patient.   Past Surgical History:  Procedure Laterality Date   ABDOMINAL HYSTERECTOMY     partial 2014    OB History   No obstetric history on file.      Home Medications    Prior to Admission medications   Medication Sig Start Date End Date Taking? Authorizing Provider  cyclobenzaprine (FLEXERIL) 10 MG tablet Take 1 tablet (10 mg total) by mouth 3 (three) times daily as needed for muscle spasms. Do not drink alcohol or drive while taking this medication.  May cause drowsiness. 09/19/21  Yes Particia Nearing, PA-C  hydrochlorothiazide (HYDRODIURIL) 12.5 MG tablet Take 1 tablet by mouth daily. 02/17/18   [provider]  ibuprofen (ADVIL) 600 MG tablet Take 1 tablet (600 mg total) by mouth every 6 (six) hours as needed. 08/31/19   Rancour, Jeannett Senior, MD  losartan (COZAAR) 50 MG tablet Take 1 tablet by mouth daily. 05/20/18   [provider]  mometasone (ASMANEX) 220 MCG/INH inhaler Inhale 1-2 puffs into the lungs daily.    [provider]  naproxen (NAPROSYN) 500 MG tablet Take 1 tablet (500 mg total) by mouth 2 (two) times daily. Patient not  taking: Reported on 06/03/2018 01/14/14   Azalia Bilis, MD  omeprazole (PRILOSEC) 20 MG capsule Take 1 capsule (20 mg total) by mouth daily. 08/31/19   Rancour, Jeannett Senior, MD  pantoprazole (PROTONIX) 20 MG tablet Take 1 tablet (20 mg total) by mouth daily. 06/03/18   Bethann Berkshire, MD  rizatriptan (MAXALT) 10 MG tablet Take 10 mg by mouth as needed for migraine. May repeat in 2 hours if needed    [provider]  topiramate (TOPAMAX) 25 MG tablet Take 1 tablet by mouth 2 (two) times daily. 02/23/18   [provider]    Family History History reviewed. No pertinent family history.  Social History Social History   Tobacco Use   Smoking status: Never   Smokeless tobacco: Never  Substance Use Topics   Alcohol use: No   Drug use: No     Allergies   Patient has no known allergies.   Review of Systems Review of Systems PER HPI  Physical Exam Triage Vital Signs ED Triage Vitals  Enc Vitals Group     BP 09/19/21 0906 (!) 142/91     Pulse Rate 09/19/21 0906 64     Resp 09/19/21 0906 18     Temp 09/19/21 0906 98.1 F (36.7 C)     Temp Source 09/19/21 0906 Oral     SpO2 09/19/21 0906 96 %     Weight --  Height --      Head Circumference --      Peak Flow --      Pain Score 09/19/21 0908 8     Pain Loc --      Pain Edu? --      Excl. in GC? --    No data found.  Updated Vital Signs BP (!) 142/91 (BP Location: Right Arm)   Pulse 64   Temp 98.1 F (36.7 C) (Oral)   Resp 18   LMP 09/01/2012 Comment: Partial Hysterectomy  SpO2 96%   Visual Acuity Right Eye Distance:   Left Eye Distance:   Bilateral Distance:    Right Eye Near:   Left Eye Near:    Bilateral Near:     Physical Exam Vitals and nursing note reviewed.  Constitutional:      Appearance: Normal appearance. She is not ill-appearing.  HENT:     Head: Atraumatic.  Eyes:     Extraocular Movements: Extraocular movements intact.     Conjunctiva/sclera: Conjunctivae normal.   Cardiovascular:     Rate and Rhythm: Normal rate and regular rhythm.     Heart sounds: Normal heart sounds.  Pulmonary:     Effort: Pulmonary effort is normal.     Breath sounds: Normal breath sounds.  Musculoskeletal:        General: Tenderness and signs of injury present. No swelling or deformity. Normal range of motion.     Cervical back: Normal range of motion and neck supple.     Comments: No midline spinal tenderness to palpation diffusely.  Negative straight leg raise bilaterally.  Localized tenderness to palpation left lateral lumbar musculature extending to left flank.  Range of motion intact but tender per patient.  Normal gait.  Skin:    General: Skin is warm and dry.  Neurological:     Mental Status: She is alert and oriented to person, place, and time.  Psychiatric:        Mood and Affect: Mood normal.        Thought Content: Thought content normal.        Judgment: Judgment normal.    UC Treatments / Results  Labs (all labs ordered are listed, but only abnormal results are displayed) Labs Reviewed - No data to display  EKG   Radiology No results found.  Procedures Procedures (including critical care time)  Medications Ordered in UC Medications  ketorolac (TORADOL) 30 MG/ML injection 30 mg (30 mg Intramuscular Given 09/19/21 0948)    Initial Impression / Assessment and Plan / UC Course  I have reviewed the triage vital signs and the nursing notes.  Pertinent labs & imaging results that were available during my care of the patient were reviewed by me and considered in my medical decision making (see chart for details).     Suspect lumbar strain, treat with IM Toradol, Flexeril, heat, stretches, massage, rest.  Work note given.  Return for worsening symptoms.  No red flag findings.  Final Clinical Impressions(s) / UC Diagnoses   Final diagnoses:  Strain of lumbar region, initial encounter   Discharge Instructions   None    ED Prescriptions      Medication Sig Dispense Auth. Provider   cyclobenzaprine (FLEXERIL) 10 MG tablet Take 1 tablet (10 mg total) by mouth 3 (three) times daily as needed for muscle spasms. Do not drink alcohol or drive while taking this medication.  May cause drowsiness. 15 tablet Particia Nearing, [place]  PDMP not reviewed this encounter.   Volney American, [place] 09/19/21 1659

## 2021-09-21 ENCOUNTER — Emergency Department (HOSPITAL_COMMUNITY)
Admission: EM | Admit: 2021-09-21 | Discharge: 2021-09-21 | Disposition: A | Discharge status: Home / Self Care | Payer: PRIVATE HEALTH INSURANCE | Attending: Emergency Medicine | Admitting: Emergency Medicine

## 2021-09-21 ENCOUNTER — Other Ambulatory Visit: Payer: Self-pay

## 2021-09-21 ENCOUNTER — Encounter (HOSPITAL_COMMUNITY): Payer: Self-pay

## 2021-09-21 DIAGNOSIS — S39012A Strain of muscle, fascia and tendon of lower back, initial encounter: Secondary | ICD-10-CM | POA: Diagnosis not present

## 2021-09-21 DIAGNOSIS — X500XXA Overexertion from strenuous movement or load, initial encounter: Secondary | ICD-10-CM | POA: Insufficient documentation

## 2021-09-21 DIAGNOSIS — I10 Essential (primary) hypertension: Secondary | ICD-10-CM | POA: Diagnosis not present

## 2021-09-21 DIAGNOSIS — S3992XA Unspecified injury of lower back, initial encounter: Secondary | ICD-10-CM | POA: Diagnosis present

## 2021-09-21 DIAGNOSIS — J45909 Unspecified asthma, uncomplicated: Secondary | ICD-10-CM | POA: Insufficient documentation

## 2021-09-21 MED ORDER — NAPROXEN 500 MG PO TABS: 500.0000 mg | ORAL_TABLET | Freq: Two times a day (BID) | ORAL | 0 refills | Status: DC

## 2021-09-21 MED ORDER — LIDOCAINE 5 % EX PTCH: 1.0000 | MEDICATED_PATCH | CUTANEOUS | 0 refills | Status: DC

## 2021-09-21 NOTE — ED Provider Notes (Signed)
[facility] Emergency Department Provider Note MRN:  [id number]  Arrival date & time: 09/21/21     Chief Complaint   Back Injury   History of Present Illness   Nancy Powell is a 41 y.o. year-old female with a history of hypertension presenting to the ED with chief complaint of back injury.  Patient was lifting a heavy door with a coworker and the coworker Lico and she was left with all of the weight suddenly.  Having persistent low back pain since that time, worse with motion, worse with standing.  No numbness or weakness to the arms or legs, no bowel or bladder dysfunction, no fever, no other complaints.  Review of Systems  A thorough review of systems was obtained and all systems are negative except as noted in the HPI and PMH.   Patient's Health History    Past Medical History:  Diagnosis Date   Asthma    Hypertension     Past Surgical History:  Procedure Laterality Date   ABDOMINAL HYSTERECTOMY     partial 2014    No family history on file.  Social History   Socioeconomic History   Marital status: Single    Spouse name: Not on file   Number of children: Not on file   Years of education: Not on file   Highest education level: Not on file  Occupational History   Not on file  Tobacco Use   Smoking status: Never   Smokeless tobacco: Never  Substance and Sexual Activity   Alcohol use: No   Drug use: No   Sexual activity: Yes    Birth control/protection: None  Other Topics Concern   Not on file  Social History Narrative   Not on file   Social Determinants of Health   Financial Resource Strain: Not on file  Food Insecurity: Not on file  Transportation Needs: Not on file  Physical Activity: Not on file  Stress: Not on file  Social Connections: Not on file  Intimate Partner Violence: Not on file     Physical Exam   Vitals:   09/21/21 0330  BP: 120/86  Pulse: 64  Resp: 18  Temp: 98 F (36.7 C)  SpO2: 100%     CONSTITUTIONAL: Well-appearing, NAD NEURO/PSYCH:  Alert and oriented x 3, no focal deficits EYES:  eyes equal and reactive ENT/NECK:  no LAD, no JVD CARDIO: Regular rate, well-perfused, normal S1 and S2 PULM:  CTAB no wheezing or rhonchi GI/GU:  non-distended, non-tender MSK/SPINE:  No gross deformities, no edema SKIN:  no rash, atraumatic   *Additional and/or pertinent findings included in MDM below  Diagnostic and Interventional Summary    EKG Interpretation  Date/Time:    Ventricular Rate:    PR Interval:    QRS Duration:   QT Interval:    QTC Calculation:   R Axis:     Text Interpretation:         Labs Reviewed - No data to display  No orders to display    Medications - No data to display   Procedures  /  Critical Care Procedures  ED Course and Medical Decision Making  Initial Impression and Ddx No red flag symptoms to suggest myelopathy, no significant trauma to warrant imaging, simply needs better pain control.  Will provide prescriptions, appropriate for discharge.  Past medical/surgical history that increases complexity of ED encounter: None  Interpretation of Diagnostics Laboratory and/or imaging options to aid in the diagnosis/care of the patient  were considered.  After careful history and physical examination, it was determined that there was no indication for diagnostics at this time.  Patient Reassessment and Ultimate Disposition/Management     Discharge  Patient management required discussion with the following services or consulting groups:  None  Complexity of Problems Addressed Acute complicated illness or Injury  Additional Data Reviewed and Analyzed Further history obtained from: None  Additional Factors Impacting ED Encounter Risk Prescriptions  Elmer Sow. Pilar Plate, MD [facility] [email or web address]  Final Clinical Impressions(s) / ED Diagnoses     ICD-10-CM   1. Low back strain,  initial encounter  S39.012A       ED Discharge Orders          Ordered    naproxen (NAPROSYN) 500 MG tablet  2 times daily        09/21/21 0408    lidocaine (LIDODERM) 5 %  Every 24 hours        09/21/21 0408             Discharge Instructions Discussed with and Provided to Patient:    Discharge Instructions      You were evaluated in the Emergency Department and after careful evaluation, we did not find any emergent condition requiring admission or further testing in the hospital.  Your exam/testing today was overall reassuring.  Symptoms seem to be due to muscle strain or spasm of the lower back from the recent event at work.  Take the Naprosyn anti-inflammatory twice daily.  Use the numbing patches daily for pain as needed.  For pain at night keeping you from sleeping, you can use the muscle relaxers.  Please return to the Emergency Department if you experience any worsening of your condition.  Thank you for allowing [place] to be a part of your care.       Sabas Sous, MD 09/21/21 [phone]

## 2021-09-21 NOTE — ED Triage Notes (Signed)
Pt presents with back pain after trying to hold a door at work tuesday. Pain located more to the lower right back. Pt seen at [facility] where she received a shot and muscle relaxer's. Pt endorses no improvement.

## 2021-09-21 NOTE — Discharge Instructions (Signed)
You were evaluated in the Emergency Department and after careful evaluation, we did not find any emergent condition requiring admission or further testing in the hospital.  Your exam/testing today was overall reassuring.  Symptoms seem to be due to muscle strain or spasm of the lower back from the recent event at work.  Take the Naprosyn anti-inflammatory twice daily.  Use the numbing patches daily for pain as needed.  For pain at night keeping you from sleeping, you can use the muscle relaxers.  Please return to the Emergency Department if you experience any worsening of your condition.  Thank you for allowing [place] to be a part of your care.

## 2021-10-29 DIAGNOSIS — I10 Essential (primary) hypertension: Secondary | ICD-10-CM | POA: Diagnosis not present

## 2021-10-29 DIAGNOSIS — M545 Low back pain, unspecified: Secondary | ICD-10-CM | POA: Diagnosis not present

## 2021-10-29 DIAGNOSIS — F411 Generalized anxiety disorder: Secondary | ICD-10-CM | POA: Diagnosis not present

## 2021-11-29 ENCOUNTER — Ambulatory Visit (INDEPENDENT_AMBULATORY_CARE_PROVIDER_SITE_OTHER): Payer: PRIVATE HEALTH INSURANCE | Admitting: Orthopaedic Surgery

## 2021-11-29 ENCOUNTER — Encounter: Payer: Self-pay | Admitting: Orthopaedic Surgery

## 2021-11-29 DIAGNOSIS — S39012A Strain of muscle, fascia and tendon of lower back, initial encounter: Secondary | ICD-10-CM | POA: Diagnosis not present

## 2021-11-29 NOTE — Progress Notes (Signed)
Office Visit Note   Patient: Nancy Powell           Date of Birth: [birth year]           MRN: [id number] Visit Date: 11/29/2021              Requested by: Carrolyn Meiers, MD [address] [place],  [place] [address] PCP: Carrolyn Meiers, MD   Assessment & Plan: Visit Diagnoses:  1. Strain of lumbar region, initial encounter     Plan: I reviewed patient's MRI with her.  Essentially her scan looks good for her age.  Muscles are normal canal looks good.  I think patient to be able to gradually ramp up and increase activity.  She obviously is not happy with the current position she is in and she can talk with her supervisor about other options versus other job opportunities.  I do not anticipate impairment with her injury.  Follow-Up Instructions: No follow-ups on file.   Orders:  No orders of the defined types were placed in this encounter.  No orders of the defined types were placed in this encounter.     Procedures: No procedures performed   Clinical Data: No additional findings.   Subjective: Chief Complaint  Patient presents with   Lower Back - Pain    OTJI 09/18/2021    HPI 41 year old female works at [employer] greater than 15 years was injured on 09/18/2021 when she was pulling on a door this been hard to lift holding the door open when another person let go the door started to slide down she jerked on it to get out of the way and had pain in her back radiating into her abdomen and down to her knee.  She is continue to work full-time on modified duty no lifting greater than 10 pounds no forceful pulling or pushing and no bending.  She has used Naprosyn cold compresses also heat.  She has been treated by Dr. Jomarie Longs had after MRI ESI had been recommended.  MRI is available for review on the disc and just shows tiny protrusion L4-5.  She has good hydration of her disc good height of her disc no lateral recess  or foraminal stenosis nor facet  arthropathy problems.  Patient is done under jobs at [employer] also in [employer].  She mentioned that she had complained multiple times about the door that does not work properly prior to the day of her injury.  Review of Systems all the systems are noncontributory to HPI.  No associated bowel or bladder symptoms no fever chills.   Objective: Vital Signs: Ht 5\' 5"  (1.651 m)   Wt 172 lb (78 kg)   LMP 09/01/2012 Comment: Partial Hysterectomy  BMI 28.62 kg/m   Physical Exam Constitutional:      Appearance: She is well-developed.  HENT:     Head: Normocephalic.     Right Ear: External ear normal.     Left Ear: External ear normal. There is no impacted cerumen.  Eyes:     Pupils: Pupils are equal, round, and reactive to light.  Neck:     Thyroid: No thyromegaly.     Trachea: No tracheal deviation.  Cardiovascular:     Rate and Rhythm: Normal rate.  Pulmonary:     Effort: Pulmonary effort is normal.  Abdominal:     Palpations: Abdomen is soft.  Musculoskeletal:     Cervical back: No rigidity.  Skin:    General:  Skin is warm and dry.  Neurological:     Mental Status: She is alert and oriented to person, place, and time.  Psychiatric:        Behavior: Behavior normal.     Ortho Exam negative straight leg raising.  Reflexes are normal.  Anterior tib EHL is normal she has a slow short stride deliberate gait.  Anterior tib gastrocsoleus is strong.  Specialty Comments:  No specialty comments available.  Imaging: No results found.   PMFS History: Patient Active Problem List   Diagnosis Date Noted   Lumbar strain 11/29/2021   Past Medical History:  Diagnosis Date   Asthma    Hypertension     No family history on file.  Past Surgical History:  Procedure Laterality Date   ABDOMINAL HYSTERECTOMY     partial 2014   Social History   Occupational History   Not on file  Tobacco Use   Smoking status: Never   Smokeless tobacco: Never  Substance  and Sexual Activity   Alcohol use: No   Drug use: No   Sexual activity: Yes    Birth control/protection: None

## 2022-02-15 DIAGNOSIS — F411 Generalized anxiety disorder: Secondary | ICD-10-CM | POA: Diagnosis not present

## 2022-02-15 DIAGNOSIS — M545 Low back pain, unspecified: Secondary | ICD-10-CM | POA: Diagnosis not present

## 2022-02-15 DIAGNOSIS — G43909 Migraine, unspecified, not intractable, without status migrainosus: Secondary | ICD-10-CM | POA: Diagnosis not present

## 2022-02-15 DIAGNOSIS — I10 Essential (primary) hypertension: Secondary | ICD-10-CM | POA: Diagnosis not present

## 2022-03-15 ENCOUNTER — Telehealth: Payer: Self-pay | Admitting: Orthopedic Surgery

## 2022-03-15 NOTE — Telephone Encounter (Signed)
I ll let you know what day   I ll do it on a day I dont  have surgery. I ll let you know Monday

## 2022-03-15 NOTE — Telephone Encounter (Signed)
Spoke w/the patient, she is requesting an appointment with Dr. Aline Brochure for a second opinion of her back.  She saw Dr. Lorin Mercy 11/29/21 and she stated he said her back is fine, but she is stating it's not that she's in a lot of pain.  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  Please advise.  Pt's # [phone]

## 2022-03-21 NOTE — Telephone Encounter (Signed)
Feb 1st 0830

## 2022-03-27 DIAGNOSIS — R87810 Cervical high risk human papillomavirus (HPV) DNA test positive: Secondary | ICD-10-CM | POA: Insufficient documentation

## 2022-04-04 ENCOUNTER — Ambulatory Visit: Payer: BC Managed Care – PPO | Admitting: Orthopedic Surgery

## 2022-05-02 ENCOUNTER — Encounter: Payer: Self-pay | Admitting: Radiology

## 2022-05-08 DIAGNOSIS — F411 Generalized anxiety disorder: Secondary | ICD-10-CM | POA: Diagnosis not present

## 2022-05-08 DIAGNOSIS — J4520 Mild intermittent asthma, uncomplicated: Secondary | ICD-10-CM | POA: Diagnosis not present

## 2022-05-08 DIAGNOSIS — I10 Essential (primary) hypertension: Secondary | ICD-10-CM | POA: Diagnosis not present

## 2022-05-08 DIAGNOSIS — Z0001 Encounter for general adult medical examination with abnormal findings: Secondary | ICD-10-CM | POA: Diagnosis not present

## 2022-06-26 DIAGNOSIS — Z01419 Encounter for gynecological examination (general) (routine) without abnormal findings: Secondary | ICD-10-CM | POA: Diagnosis not present

## 2022-06-26 DIAGNOSIS — R61 Generalized hyperhidrosis: Secondary | ICD-10-CM | POA: Diagnosis not present

## 2022-06-26 DIAGNOSIS — Z113 Encounter for screening for infections with a predominantly sexual mode of transmission: Secondary | ICD-10-CM | POA: Diagnosis not present

## 2022-06-26 DIAGNOSIS — Z1272 Encounter for screening for malignant neoplasm of vagina: Secondary | ICD-10-CM | POA: Diagnosis not present

## 2022-06-26 DIAGNOSIS — Z1231 Encounter for screening mammogram for malignant neoplasm of breast: Secondary | ICD-10-CM | POA: Diagnosis not present

## 2022-07-08 DIAGNOSIS — N281 Cyst of kidney, acquired: Secondary | ICD-10-CM | POA: Diagnosis not present

## 2022-07-08 DIAGNOSIS — I10 Essential (primary) hypertension: Secondary | ICD-10-CM | POA: Diagnosis not present

## 2022-07-09 ENCOUNTER — Other Ambulatory Visit (HOSPITAL_COMMUNITY): Payer: Self-pay | Admitting: Nephrology

## 2022-07-09 DIAGNOSIS — N281 Cyst of kidney, acquired: Secondary | ICD-10-CM

## 2022-08-07 DIAGNOSIS — I10 Essential (primary) hypertension: Secondary | ICD-10-CM | POA: Diagnosis not present

## 2022-08-07 DIAGNOSIS — N281 Cyst of kidney, acquired: Secondary | ICD-10-CM | POA: Diagnosis not present

## 2022-08-15 DIAGNOSIS — B009 Herpesviral infection, unspecified: Secondary | ICD-10-CM | POA: Diagnosis not present

## 2022-08-15 DIAGNOSIS — Z09 Encounter for follow-up examination after completed treatment for conditions other than malignant neoplasm: Secondary | ICD-10-CM | POA: Diagnosis not present

## 2022-08-15 DIAGNOSIS — E559 Vitamin D deficiency, unspecified: Secondary | ICD-10-CM | POA: Diagnosis not present

## 2022-08-15 DIAGNOSIS — B001 Herpesviral vesicular dermatitis: Secondary | ICD-10-CM | POA: Diagnosis not present

## 2022-08-26 ENCOUNTER — Telehealth: Payer: Self-pay

## 2022-08-26 NOTE — Telephone Encounter (Signed)
Patient was scheduled for a second opinion back in February and she canceled due to being out of town. She has called to reschedule this appointment, but I am asking when you would like for her to come in. This started out as [employer] but now she is not filing it like that..... She did see Dr. Ophelia Charter for this to begin with.    Please advise.

## 2022-09-04 DIAGNOSIS — M2042 Other hammer toe(s) (acquired), left foot: Secondary | ICD-10-CM | POA: Diagnosis not present

## 2022-09-04 DIAGNOSIS — M79672 Pain in left foot: Secondary | ICD-10-CM | POA: Diagnosis not present

## 2022-09-04 DIAGNOSIS — M2041 Other hammer toe(s) (acquired), right foot: Secondary | ICD-10-CM | POA: Diagnosis not present

## 2022-09-04 DIAGNOSIS — M71571 Other bursitis, not elsewhere classified, right ankle and foot: Secondary | ICD-10-CM | POA: Diagnosis not present

## 2022-09-04 DIAGNOSIS — M79671 Pain in right foot: Secondary | ICD-10-CM | POA: Diagnosis not present

## 2022-11-30 DIAGNOSIS — R030 Elevated blood-pressure reading, without diagnosis of hypertension: Secondary | ICD-10-CM | POA: Diagnosis not present

## 2022-11-30 DIAGNOSIS — E663 Overweight: Secondary | ICD-10-CM | POA: Diagnosis not present

## 2022-11-30 DIAGNOSIS — T300 Burn of unspecified body region, unspecified degree: Secondary | ICD-10-CM | POA: Diagnosis not present

## 2022-11-30 DIAGNOSIS — Z6828 Body mass index (BMI) 28.0-28.9, adult: Secondary | ICD-10-CM | POA: Diagnosis not present

## 2022-12-05 DIAGNOSIS — T300 Burn of unspecified body region, unspecified degree: Secondary | ICD-10-CM | POA: Diagnosis not present

## 2022-12-05 DIAGNOSIS — E663 Overweight: Secondary | ICD-10-CM | POA: Diagnosis not present

## 2022-12-05 DIAGNOSIS — R030 Elevated blood-pressure reading, without diagnosis of hypertension: Secondary | ICD-10-CM | POA: Diagnosis not present

## 2022-12-05 DIAGNOSIS — Z6829 Body mass index (BMI) 29.0-29.9, adult: Secondary | ICD-10-CM | POA: Diagnosis not present

## 2023-02-10 DIAGNOSIS — J4520 Mild intermittent asthma, uncomplicated: Secondary | ICD-10-CM | POA: Diagnosis not present

## 2023-02-10 DIAGNOSIS — I10 Essential (primary) hypertension: Secondary | ICD-10-CM | POA: Diagnosis not present

## 2023-02-10 DIAGNOSIS — F411 Generalized anxiety disorder: Secondary | ICD-10-CM | POA: Diagnosis not present

## 2023-07-04 ENCOUNTER — Encounter: Payer: Self-pay | Admitting: Radiology

## 2023-10-14 ENCOUNTER — Encounter (HOSPITAL_COMMUNITY): Payer: Self-pay

## 2023-10-14 ENCOUNTER — Emergency Department (HOSPITAL_COMMUNITY)

## 2023-10-14 ENCOUNTER — Emergency Department (HOSPITAL_COMMUNITY)
Admission: EM | Admit: 2023-10-14 | Discharge: 2023-10-14 | Disposition: A | Discharge status: Home / Self Care | Attending: Emergency Medicine | Admitting: Emergency Medicine

## 2023-10-14 DIAGNOSIS — F17200 Nicotine dependence, unspecified, uncomplicated: Secondary | ICD-10-CM | POA: Diagnosis not present

## 2023-10-14 DIAGNOSIS — R55 Syncope and collapse: Secondary | ICD-10-CM

## 2023-10-14 DIAGNOSIS — E876 Hypokalemia: Secondary | ICD-10-CM | POA: Diagnosis not present

## 2023-10-14 DIAGNOSIS — R0789 Other chest pain: Secondary | ICD-10-CM | POA: Insufficient documentation

## 2023-10-14 DIAGNOSIS — R079 Chest pain, unspecified: Secondary | ICD-10-CM

## 2023-10-14 LAB — D-DIMER, QUANTITATIVE: D-Dimer, Quant: 0.37 ug{FEU}/mL (ref 0.00–0.50)

## 2023-10-14 LAB — BASIC METABOLIC PANEL WITH GFR
Anion gap: 13 (ref 5–15)
BUN: 13 mg/dL (ref 6–20)
CO2: 25 mmol/L (ref 22–32)
Calcium: 9.3 mg/dL (ref 8.9–10.3)
Chloride: 99 mmol/L (ref 98–111)
Creatinine, Ser: 0.96 mg/dL (ref 0.44–1.00)
GFR, Estimated: >60 mL/min (ref >60)
Glucose, Bld: 87 mg/dL (ref 70–99)
Potassium: 3.3 mmol/L — ABNORMAL LOW (ref 3.5–5.1)
Sodium: 137 mmol/L (ref 135–145)

## 2023-10-14 LAB — CBC
HCT: 42.6 % (ref 36.0–46.0)
Hemoglobin: 13.7 g/dL (ref 12.0–15.0)
MCH: 29.1 pg (ref 26.0–34.0)
MCHC: 32.2 g/dL (ref 30.0–36.0)
MCV: 90.6 fL (ref 80.0–100.0)
Platelets: 260 K/uL (ref 150–400)
RBC: 4.7 MIL/uL (ref 3.87–5.11)
RDW: 13.5 % (ref 11.5–15.5)
WBC: 3.9 K/uL — ABNORMAL LOW (ref 4.0–10.5)
nRBC: 0 % (ref 0.0–0.2)

## 2023-10-14 LAB — URINALYSIS, ROUTINE W REFLEX MICROSCOPIC
Bacteria, UA: NONE SEEN
Bilirubin Urine: NEGATIVE
Glucose, UA: NEGATIVE mg/dL
Ketones, ur: NEGATIVE mg/dL
Leukocytes,Ua: NEGATIVE
Nitrite: NEGATIVE
Protein, ur: NEGATIVE mg/dL
Specific Gravity, Urine: 1.017 (ref 1.005–1.030)
pH: 6 (ref 5.0–8.0)

## 2023-10-14 LAB — TROPONIN I (HIGH SENSITIVITY): Troponin I (High Sensitivity): 2 ng/L (ref <18)

## 2023-10-14 MED ORDER — POTASSIUM CHLORIDE CRYS ER 20 MEQ PO TBCR
40.0000 meq | EXTENDED_RELEASE_TABLET | Freq: Once | ORAL | Status: AC
Start: 2023-10-14 — End: 2023-10-14
  Administered 2023-10-14 (×2): 40 meq via ORAL
  Filled 2023-10-14: qty 2

## 2023-10-14 MED ORDER — LACTATED RINGERS IV BOLUS
1000.0000 mL | Freq: Once | INTRAVENOUS | Status: AC
  Administered 2023-10-14 (×2): 1000 mL via INTRAVENOUS

## 2023-10-14 MED ORDER — KETOROLAC TROMETHAMINE 15 MG/ML IJ SOLN
15.0000 mg | Freq: Once | INTRAMUSCULAR | Status: AC
  Administered 2023-10-14 (×2): 15 mg via INTRAVENOUS
  Filled 2023-10-14: qty 1

## 2023-10-14 NOTE — ED Provider Notes (Signed)
 [facility] AT [facility] Provider Note   CSN: [id number] Arrival date & time: 10/14/23  1330     Patient presents with: No chief complaint on file.   Nancy Powell is a 43 y.o. female. Presents ER today complaining of left-sided chest pain and syncope.  She states this started last night while she was sitting out on her porch.  She started feeling very lightheaded while sitting down and was having some pain in the left side of her lower chest under the breast when breathing.  She got up to try to go and lay down and her significant other was helping her, he reports she lost consciousness for about 10 seconds but fortunately he was able to catch her) her from falling down.  She states had gotten more dizzy and passed out when she had gotten up from standing.  Significant other at bedside states she did not have any seizure-like activity.  No loss of bowel or bladder function.  She tried to get back up and get back to bed and she states that soon as she stood back up she got very lightheaded and had another syncopal event similar to the previous.  States today she continues to have about 4 out of 10 left chest pain that is worse with inspiration and expiration and feel generally weak.  Patient reports her chest pain is not exertional and denies any radiation of the pain into her back but does state it was from under the left breast to the left lateral chest and left upper abdomen under the ribs.  She denies history of VTE, no lower extremity swelling or pain, no shortness of breath.  Patient has history of hysterectomy and is not on any birth control or hormone replacement therapy.  Smokes cigarettes and occasionally marijuana, uses occasional alcohol but no other drug use.  Denies family history of cardiac disease.   HPI     Prior to Admission medications   Medication Sig Start Date End Date Taking? Authorizing Provider  albuterol  (VENTOLIN  HFA) 108 (90 Base)  MCG/ACT inhaler Inhale 1-2 puffs into the lungs every 6 (six) hours as needed for wheezing or shortness of breath.   Yes [provider]  losartan-hydrochlorothiazide (HYZAAR) 100-12.5 MG tablet Take 1 tablet by mouth daily.   Yes [provider]  OVER THE COUNTER MEDICATION Take 2 tablets by mouth daily. Brain Booster, Memory and Brain Support Gummies Vit B6, Folate, Vit B12, Sodium, Decaf Arabica Coffee bean extract, phosphatidylserine   Yes [provider]  tiZANidine (ZANAFLEX) 2 MG tablet Take 2 mg by mouth every 8 (eight) hours as needed for muscle spasms. 10/29/21  Yes [provider]    Allergies: Topiramate    Review of Systems  Updated Vital Signs BP (!) 136/90   Pulse 72   Temp 98.8 F (37.1 C) (Oral)   Resp 13   Ht 5' 5 (1.651 m)   Wt 72.6 kg   LMP 09/01/2012 Comment: Partial Hysterectomy  SpO2 99%   BMI 26.63 kg/m   Physical Exam Vitals and nursing note reviewed.  Constitutional:      General: She is not in acute distress.    Appearance: She is well-developed.  HENT:     Head: Normocephalic and atraumatic.     Mouth/Throat:     Mouth: Mucous membranes are moist.  Eyes:     Extraocular Movements: Extraocular movements intact.     Conjunctiva/sclera: Conjunctivae normal.  Pupils: Pupils are equal, round, and reactive to light.  Cardiovascular:     Rate and Rhythm: Normal rate and regular rhythm.     Heart sounds: No murmur heard. Pulmonary:     Effort: Pulmonary effort is normal. No respiratory distress.     Breath sounds: Normal breath sounds.  Chest:     Chest wall: No deformity, tenderness or crepitus.  Abdominal:     Palpations: Abdomen is soft.     Tenderness: There is no abdominal tenderness. There is no right CVA tenderness or left CVA tenderness.  Musculoskeletal:        General: No swelling or tenderness.     Cervical back: Neck supple.  Skin:    General: Skin is warm and dry.     Capillary Refill:  Capillary refill takes less than 2 seconds.  Neurological:     General: No focal deficit present.     Mental Status: She is alert and oriented to person, place, and time.     Sensory: No sensory deficit.     Motor: No weakness.  Psychiatric:        Mood and Affect: Mood normal.     (all labs ordered are listed, but only abnormal results are displayed) Labs Reviewed  BASIC METABOLIC PANEL WITH GFR - Abnormal; Notable for the following components:      Result Value   Potassium 3.3 (*)    All other components within normal limits  CBC - Abnormal; Notable for the following components:   WBC 3.9 (*)    All other components within normal limits  URINALYSIS, ROUTINE W REFLEX MICROSCOPIC - Abnormal; Notable for the following components:   Hgb urine dipstick MODERATE (*)    All other components within normal limits  D-DIMER, QUANTITATIVE  TROPONIN I (HIGH SENSITIVITY)  TROPONIN I (HIGH SENSITIVITY)    EKG: None  Radiology: [facility] Chest Port 1 View Result Date: 10/14/2023 CLINICAL DATA:  Shortness of breath EXAM: PORTABLE CHEST 1 VIEW COMPARISON:  Chest radiograph August 31, 2019 FINDINGS: The heart size and mediastinal contours are within normal limits. Both lungs are clear. The visualized skeletal structures are unremarkable. IMPRESSION: No active disease. Electronically Signed   By: Megan  Zare M.D.   On: 10/14/2023 15:01     Procedures   Medications Ordered in the ED  lactated ringers  bolus 1,000 mL (1,000 mLs Intravenous New Bag/Given 10/14/23 1545)  ketorolac  (TORADOL ) 15 MG/ML injection 15 mg (15 mg Intravenous Given 10/14/23 1544)  potassium chloride  SA (KLOR-CON  M) CR tablet 40 mEq (40 mEq Oral Given 10/14/23 1544)                                    Medical Decision Making This patient presents to the ED for concern of chest pain, , this involves an extensive number of treatment options, and is a complaint that carries with it a high risk of complications and morbidity.  The  differential diagnosis includes ACS, PE, dissection, pneumonia, syncope, seizure, other   Co morbidities that complicate the patient evaluation :   Hypertension    Additional history obtained:  Additional history obtained from EMR External records from outside source obtained and reviewed including prior notes and labs   Lab Tests:  I Ordered, and personally interpreted labs.  The pertinent results include: D-dimer negative, troponin negative, mild hypokalemia with potassium 3.3, CBC reassuring   Imaging Studies ordered:  I ordered imaging studies including chest x-ray which shows pulmonary edema and infiltrate or pneumothorax I independently visualized and interpreted imaging within scope of identifying emergent findings  I agree with the radiologist interpretation   Cardiac Monitoring: / EKG:  The patient was maintained on a cardiac monitor.  I personally viewed and interpreted the cardiac monitored which showed an underlying rhythm of: Sinus rhythm with no ST or T wave changes     Problem List / ED Course / Critical interventions / Medication management  Patient presents with chest pain and syncope that started yesterday.  Feeling overall unwell and lightheaded last night and upon standing got much more lightheaded and had syncopal episode, tried to get back up and had another syncopal episode.  Denies overexertion, GI losses.  She is not feeling dizzy but has some generalized malaise and fatigue.  She had this happen before but was worried today because she was having chest pain with it.  It does hurt somewhat to take a deep breath but she is not short of breath.  Her vitals are normal with no hypoxia or tachycardia EKG is reassuring.  Given the pleuritic nature of the pain and syncope there was concern for possible PE.  Patient's [place] score is 3 and D-dimer is negative, taking PE extremely unlikely, do not feel patient needs a CTA.  Her troponin is negative, EKG as noted  reassuring.  No leukocytosis, no anemia, chest x-ray is notable.  Her pain is not reproducible.  She had normal orthostatic vital signs.  She is feeling better after Toradol  and IV fluids and has reassuring vitals.  She is not having radiation of the pain into her back or tearing pain to suggest a dissection.  She did have some pain in the left upper around the lateral abdomen under the ribs.  No CVA tenderness to suggest kidney infection or pyelonephritis.  No red blood cells white blood cells leukocytes or bacteria in her urine.  I do not feel she needs any further imaging such as CT abdomen pelvis for further evaluation of this at this time.  I discussed with patient I do not have good explanation for her pain but overall workup is reassuring.  Her [place] syncope rule negative so do not feel she needs admission for syncope workup.  She is low risk on heart score and troponin negative so no need for inpatient cardiac workup.  I discussed with her that her symptoms are concerning and should follow-up with cardiology for evaluation, they may want to do a Holter monitor/Zio patch especially if she is having any palpitations, or has more dizziness.  Patient verbalized understanding is agreeable with plan of care and discharge.  Cardiology referral was placed.  She instructed that if she has recurrence of symptoms specially if she has repeat syncope she is to come back to the ER right away and would likely need admission at that time.  I ordered medication including Toradol  for chest pain Reevaluation of the patient after these medicines showed that the patient improved I have reviewed the patients home medicines and have made adjustments as needed   Social Determinants of Health:  Started smoking about 8 months ago   Test / Admission - Considered:  Consider the need for admission, not at this time not indicated  Discussed with Dr. Pamella while patient was in the emergency  department.  Amount and/or Complexity of Data Reviewed Labs: ordered. Radiology: ordered.  Risk Prescription drug management.  Final diagnoses:  Syncope and collapse  Chest pain, unspecified type    ED Discharge Orders          Ordered    Ambulatory referral to Cardiology       Comments: If you have not heard from the Cardiology office within the next 72 hours please call [phone].   10/14/23 [address] PA-C 10/14/23 1631    Pamella Ozell LABOR, DO 10/22/23 [phone]

## 2023-10-14 NOTE — Discharge Instructions (Addendum)
 Is a pleasure taking care of you today.  You were seen for chest pain and passing out last night.  Fortunately your workup today was very reassuring with no sign of an active heart attack, blood clot in your lung, abnormal heart rhythm or other emergency.  You need to follow-up with your PCP and cardiology.  Please come back to the ER if you have new or worsening symptoms.  Recommend quitting smoking to decrease risk of heart disease.  As discussed, things could always change and we recommend you come back if you have any new or worsening symptoms such as passing out again, worsening dizziness, chest pain especially if it gets worse when you exert yourself, shortness of breath, sweating, etc.  Drink fluids to stay hydrated.

## 2023-10-14 NOTE — ED Triage Notes (Signed)
 Yesterday pt became dizzy and went to lay down and passed out. Pt's boyfriend caught pt so she did not hit anything or fall. Pt LOC for about 10-20 seconds. Pt then passed out again for 10- 20 seonds. Pt's BF helped her to lay down on the bed and fell asleep. All through this pt had left sided chest pain that is right under my ribs. Pain radiates to the back. Pt is A&Ox4 in triage.

## 2023-11-05 ENCOUNTER — Encounter: Payer: Self-pay | Admitting: Internal Medicine

## 2023-11-11 ENCOUNTER — Encounter: Payer: Self-pay | Admitting: Physician Assistant
